# Patient Record
Sex: Female | Born: 1990
Health system: Southern US, Community
[De-identification: ages and names within clinical notes are randomized; demographics above are authoritative.]

## PROBLEM LIST (undated history)

## (undated) HISTORY — PX: ANTERIOR CRUCIATE LIGAMENT REPAIR: SHX115

---

## 2011-12-21 ENCOUNTER — Encounter (HOSPITAL_BASED_OUTPATIENT_CLINIC_OR_DEPARTMENT_OTHER): Payer: Self-pay

## 2011-12-21 ENCOUNTER — Emergency Department (HOSPITAL_BASED_OUTPATIENT_CLINIC_OR_DEPARTMENT_OTHER)
Admission: EM | Admit: 2011-12-21 | Discharge: 2011-12-21 | Disposition: A | Discharge status: Home / Self Care | Payer: Self-pay | Attending: Emergency Medicine | Admitting: Emergency Medicine

## 2011-12-21 DIAGNOSIS — R197 Diarrhea, unspecified: Secondary | ICD-10-CM | POA: Insufficient documentation

## 2011-12-21 DIAGNOSIS — R112 Nausea with vomiting, unspecified: Secondary | ICD-10-CM | POA: Insufficient documentation

## 2011-12-21 DIAGNOSIS — J45909 Unspecified asthma, uncomplicated: Secondary | ICD-10-CM | POA: Insufficient documentation

## 2011-12-21 LAB — DIFFERENTIAL
Basophils Absolute: 0 10*3/uL (ref 0.0–0.1)
Eosinophils Relative: 1 % (ref 0–5)
Lymphocytes Relative: 6 % — ABNORMAL LOW (ref 12–46)
Lymphs Abs: 0.4 10*3/uL — ABNORMAL LOW (ref 0.7–4.0)
Neutro Abs: 5.8 10*3/uL (ref 1.7–7.7)
Neutrophils Relative %: 89 % — ABNORMAL HIGH (ref 43–77)

## 2011-12-21 LAB — URINALYSIS, ROUTINE W REFLEX MICROSCOPIC
Bilirubin Urine: NEGATIVE
Glucose, UA: NEGATIVE mg/dL
Hgb urine dipstick: NEGATIVE
Ketones, ur: 15 mg/dL — AB
Specific Gravity, Urine: 1.028 (ref 1.005–1.030)
pH: 7.5 (ref 5.0–8.0)

## 2011-12-21 LAB — COMPREHENSIVE METABOLIC PANEL
ALT: 9 U/L (ref 0–35)
AST: 19 U/L (ref 0–37)
Alkaline Phosphatase: 62 U/L (ref 39–117)
CO2: 25 mEq/L (ref 19–32)
Calcium: 9.1 mg/dL (ref 8.4–10.5)
GFR calc non Af Amer: 80 mL/min — ABNORMAL LOW (ref >90)
Potassium: 3.9 mEq/L (ref 3.5–5.1)
Sodium: 139 mEq/L (ref 135–145)

## 2011-12-21 LAB — URINE MICROSCOPIC-ADD ON

## 2011-12-21 LAB — CBC
MCV: 86.5 fL (ref 78.0–100.0)
Platelets: 217 10*3/uL (ref 150–400)
RBC: 4.74 MIL/uL (ref 3.87–5.11)
RDW: 11.5 % (ref 11.5–15.5)
WBC: 6.5 10*3/uL (ref 4.0–10.5)

## 2011-12-21 MED ORDER — SODIUM CHLORIDE 0.9 % IV SOLN
Freq: Once | INTRAVENOUS | Status: AC
  Administered 2011-12-21: 999 mL via INTRAVENOUS

## 2011-12-21 MED ORDER — KETOROLAC TROMETHAMINE 30 MG/ML IJ SOLN
30.0000 mg | Freq: Once | INTRAMUSCULAR | Status: AC
  Administered 2011-12-21: 30 mg via INTRAVENOUS
  Filled 2011-12-21: qty 1

## 2011-12-21 MED ORDER — ONDANSETRON HCL 4 MG/2ML IJ SOLN
4.0000 mg | Freq: Once | INTRAMUSCULAR | Status: AC
  Administered 2011-12-21: 4 mg via INTRAVENOUS
  Filled 2011-12-21: qty 2

## 2011-12-21 MED ORDER — PROMETHAZINE HCL 25 MG PO TABS: 25.0000 mg | ORAL_TABLET | Freq: Four times a day (QID) | ORAL | Status: AC | PRN

## 2011-12-21 NOTE — ED Notes (Signed)
abd pain started yesterday-n/v/d started this am

## 2011-12-21 NOTE — ED Notes (Signed)
No v/d while in ED-pt tolerated po-when in to d/c, pt requested RTS note

## 2011-12-21 NOTE — ED Provider Notes (Signed)
History     CSN: [id number]  Arrival date & time 12/21/11  1104   First MD Initiated Contact with Patient 12/21/11 1209      Chief Complaint  Patient presents with  . Abdominal Pain    (Consider location/radiation/quality/duration/timing/severity/associated sxs/prior treatment) Patient is a 21 y.o. female presenting with vomiting. The history is provided by the patient. No language interpreter was used.  Emesis  This is a new problem. The current episode started 12 to 24 hours ago. The problem occurs 5 to 10 times per day. The problem has been gradually worsening. The emesis has an appearance of stomach contents. There has been no fever. Associated symptoms include abdominal pain, diarrhea and myalgias. Risk factors include ill contacts.   Pt complains of vomiting, diarrhea and abdominal soreness.  Pt began feeling sick today. Past Medical History  Diagnosis Date  . Asthma     Past Surgical History  Procedure Date  . Anterior cruciate ligament repair     No family history on file.  History  Substance Use Topics  . Smoking status: Never Smoker   . Smokeless tobacco: Not on file  . Alcohol Use: No    OB History    Grav Para Term Preterm Abortions TAB SAB Ect Mult Living                  Review of Systems  Gastrointestinal: Positive for nausea, vomiting, abdominal pain and diarrhea.  Musculoskeletal: Positive for myalgias.  All other systems reviewed and are negative.    Allergies  Review of patient's allergies indicates no known allergies.  Home Medications   Current Outpatient Rx  Name Route Sig Dispense Refill  . FLOVENT IN Inhalation Inhale into the lungs.    Marland Kitchen SINGULAIR PO Oral Take by mouth.    Marland Kitchen PIRBUTEROL ACETATE 200 MCG/INH IN AERB Inhalation Inhale 2 puffs into the lungs 4 (four) times daily.      BP 109/57  Pulse 99  Temp(Src) 98.9 F (37.2 C) (Oral)  Resp 16  Ht 5\' 3"  (1.6 m)  Wt 150 lb (68.04 kg)  BMI 26.57 kg/m2  SpO2 100%  LMP  12/13/2011  Physical Exam  Vitals reviewed. Constitutional: She is oriented to person, place, and time. She appears well-developed and well-nourished.  HENT:  Head: Normocephalic and atraumatic.  Right Ear: External ear normal.  Left Ear: External ear normal.  Nose: Nose normal.  Mouth/Throat: Oropharynx is clear and moist.  Eyes: Conjunctivae are normal. Pupils are equal, round, and reactive to light.  Neck: Normal range of motion. Neck supple.  Cardiovascular: Normal rate and normal heart sounds.   Pulmonary/Chest: Effort normal.  Abdominal: Soft. Bowel sounds are normal.  Musculoskeletal: Normal range of motion.  Neurological: She is alert and oriented to person, place, and time. She has normal reflexes.  Skin: Skin is warm.  Psychiatric: She has a normal mood and affect.    ED Course  Procedures (including critical care time)  Labs Reviewed  URINALYSIS, ROUTINE W REFLEX MICROSCOPIC - Abnormal; Notable for the following:    APPearance CLOUDY (*)    Ketones, ur 15 (*)    Protein, ur 30 (*)    Leukocytes, UA TRACE (*)    All other components within normal limits  URINE MICROSCOPIC-ADD ON - Abnormal; Notable for the following:    Squamous Epithelial / LPF MANY (*)    Bacteria, UA MANY (*)    All other components within normal limits  DIFFERENTIAL - Abnormal;  Notable for the following:    Neutrophils Relative 89 (*)    Lymphocytes Relative 6 (*)    Lymphs Abs 0.4 (*)    All other components within normal limits  COMPREHENSIVE METABOLIC PANEL - Abnormal; Notable for the following:    GFR calc non Af Amer 80 (*)    All other components within normal limits  PREGNANCY, URINE  CBC   No results found.   No diagnosis found.    MDM   Results for orders placed during the hospital encounter of 12/21/11  URINALYSIS, ROUTINE W REFLEX MICROSCOPIC      Component Value Range   Color, Urine YELLOW  YELLOW    APPearance CLOUDY (*) CLEAR    Specific Gravity, Urine 1.028   1.005 - 1.030    pH 7.5  5.0 - 8.0    Glucose, UA NEGATIVE  NEGATIVE (mg/dL)   Hgb urine dipstick NEGATIVE  NEGATIVE    Bilirubin Urine NEGATIVE  NEGATIVE    Ketones, ur 15 (*) NEGATIVE (mg/dL)   Protein, ur 30 (*) NEGATIVE (mg/dL)   Urobilinogen, UA 1.0  0.0 - 1.0 (mg/dL)   Nitrite NEGATIVE  NEGATIVE    Leukocytes, UA TRACE (*) NEGATIVE   PREGNANCY, URINE      Component Value Range   Preg Test, Ur NEGATIVE    URINE MICROSCOPIC-ADD ON      Component Value Range   Squamous Epithelial / LPF MANY (*) RARE    WBC, UA 3-6  <3 (WBC/hpf)   RBC / HPF 0-2  <3 (RBC/hpf)   Bacteria, UA MANY (*) RARE   CBC      Component Value Range   WBC 6.5  4.0 - 10.5 (K/uL)   RBC 4.74  3.87 - 5.11 (MIL/uL)   Hemoglobin 14.6  12.0 - 15.0 (g/dL)   HCT 40.9  81.1 - 91.4 (%)   MCV 86.5  78.0 - 100.0 (fL)   MCH 30.8  26.0 - 34.0 (pg)   MCHC 35.6  30.0 - 36.0 (g/dL)   RDW 78.2  95.6 - 21.3 (%)   Platelets 217  150 - 400 (K/uL)  DIFFERENTIAL      Component Value Range   Neutrophils Relative 89 (*) 43 - 77 (%)   Neutro Abs 5.8  1.7 - 7.7 (K/uL)   Lymphocytes Relative 6 (*) 12 - 46 (%)   Lymphs Abs 0.4 (*) 0.7 - 4.0 (K/uL)   Monocytes Relative 4  3 - 12 (%)   Monocytes Absolute 0.3  0.1 - 1.0 (K/uL)   Eosinophils Relative 1  0 - 5 (%)   Eosinophils Absolute 0.0  0.0 - 0.7 (K/uL)   Basophils Relative 0  0 - 1 (%)   Basophils Absolute 0.0  0.0 - 0.1 (K/uL)  COMPREHENSIVE METABOLIC PANEL      Component Value Range   Sodium 139  135 - 145 (mEq/L)   Potassium 3.9  3.5 - 5.1 (mEq/L)   Chloride 105  96 - 112 (mEq/L)   CO2 25  19 - 32 (mEq/L)   Glucose, Bld 94  70 - 99 (mg/dL)   BUN 12  6 - 23 (mg/dL)   Creatinine, Ser 0.86  0.50 - 1.10 (mg/dL)   Calcium 9.1  8.4 - 57.8 (mg/dL)   Total Protein 7.2  6.0 - 8.3 (g/dL)   Albumin 4.3  3.5 - 5.2 (g/dL)   AST 19  0 - 37 (U/L)   ALT 9  0 - 35 (U/L)  Alkaline Phosphatase 62  39 - 117 (U/L)   Total Bilirubin 0.8  0.3 - 1.2 (mg/dL)   GFR calc non Af Amer 80  (*) >90 (mL/min)   GFR calc Af Amer >90  >90 (mL/min)   No results found. Pt given iv and zofran.   I advised return if symptoms last more than 24 hours        Kimberly Golden, [place] 12/21/11 1454

## 2011-12-21 NOTE — ED Provider Notes (Signed)
History/physical exam/procedure(s) were performed by non-physician practitioner and as supervising physician I was immediately available for consultation/collaboration. I have reviewed all notes and am in agreement with care and plan.   Hilario Quarry, MD 12/21/11 [phone]

## 2013-05-09 ENCOUNTER — Emergency Department (HOSPITAL_BASED_OUTPATIENT_CLINIC_OR_DEPARTMENT_OTHER): Payer: Self-pay

## 2013-05-09 ENCOUNTER — Emergency Department (HOSPITAL_BASED_OUTPATIENT_CLINIC_OR_DEPARTMENT_OTHER)
Admission: EM | Admit: 2013-05-09 | Discharge: 2013-05-09 | Disposition: A | Discharge status: Home / Self Care | Payer: Self-pay | Attending: Emergency Medicine | Admitting: Emergency Medicine

## 2013-05-09 ENCOUNTER — Encounter (HOSPITAL_BASED_OUTPATIENT_CLINIC_OR_DEPARTMENT_OTHER): Payer: Self-pay | Admitting: Family Medicine

## 2013-05-09 DIAGNOSIS — S60229A Contusion of unspecified hand, initial encounter: Secondary | ICD-10-CM | POA: Insufficient documentation

## 2013-05-09 DIAGNOSIS — Y929 Unspecified place or not applicable: Secondary | ICD-10-CM | POA: Insufficient documentation

## 2013-05-09 DIAGNOSIS — J45909 Unspecified asthma, uncomplicated: Secondary | ICD-10-CM | POA: Insufficient documentation

## 2013-05-09 DIAGNOSIS — F17200 Nicotine dependence, unspecified, uncomplicated: Secondary | ICD-10-CM | POA: Insufficient documentation

## 2013-05-09 DIAGNOSIS — Y93B9 Activity, other involving muscle strengthening exercises: Secondary | ICD-10-CM | POA: Insufficient documentation

## 2013-05-09 DIAGNOSIS — S60221A Contusion of right hand, initial encounter: Secondary | ICD-10-CM

## 2013-05-09 DIAGNOSIS — IMO0002 Reserved for concepts with insufficient information to code with codable children: Secondary | ICD-10-CM | POA: Insufficient documentation

## 2013-05-09 MED ORDER — IBUPROFEN 600 MG PO TABS: 600.0000 mg | ORAL_TABLET | Freq: Four times a day (QID) | ORAL | Status: DC | PRN

## 2013-05-09 MED ORDER — IBUPROFEN 400 MG PO TABS
400.0000 mg | ORAL_TABLET | Freq: Once | ORAL | Status: AC
  Administered 2013-05-09: 400 mg via ORAL
  Filled 2013-05-09: qty 1

## 2013-05-09 NOTE — ED Notes (Signed)
Pt sts she was exercising by hitting a punching bag and had swollen and painful right hand x 3 days. Cms intact.

## 2013-05-09 NOTE — ED Provider Notes (Signed)
History     CSN: [id number]  Arrival date & time 05/09/13  4098   First MD Initiated Contact with Patient 05/09/13 [phone]      Chief Complaint  Patient presents with  . Hand Pain    (Consider location/radiation/quality/duration/timing/severity/associated sxs/prior treatment) Patient is a 22 y.o. female presenting with hand pain. The history is provided by the patient.  Hand Pain This is a new problem. The current episode started more than 2 days ago (Pt had been punching a punching bag when pain and swelling first began.). The problem occurs constantly. The problem has been gradually improving. Exacerbated by: palpation and making a fist, especially in the mornings. Nothing relieves the symptoms. She has tried a cold compress for the symptoms. The treatment provided mild relief.    Past Medical History  Diagnosis Date  . Asthma     Past Surgical History  Procedure Laterality Date  . Anterior cruciate ligament repair      History reviewed. No pertinent family history.  History  Substance Use Topics  . Smoking status: Current Some Day Smoker  . Smokeless tobacco: Not on file  . Alcohol Use: No    OB History   Grav Para Term Preterm Abortions TAB SAB Ect Mult Living                  Review of Systems  Musculoskeletal: Positive for joint swelling and arthralgias.  Skin: Negative for color change and wound.  Neurological: Negative for weakness and numbness.    Allergies  Review of patient's allergies indicates no known allergies.  Home Medications   Current Outpatient Rx  Name  Route  Sig  Dispense  Refill  . Fluticasone Propionate, Inhal, (FLOVENT IN)   Inhalation   Inhale into the lungs.         Marland Kitchen ibuprofen (ADVIL,MOTRIN) 600 MG tablet   Oral   Take 1 tablet (600 mg total) by mouth every 6 (six) hours as needed for pain.   30 tablet   0   . Montelukast Sodium (SINGULAIR PO)   Oral   Take by mouth.         . pirbuterol (MAXAIR) 200 MCG/INH  inhaler   Inhalation   Inhale 2 puffs into the lungs 4 (four) times daily.           BP 124/81  Pulse 68  Temp(Src) 98.4 F (36.9 C) (Oral)  Resp 20  SpO2 100%  LMP 05/01/2013  Physical Exam  Nursing note and vitals reviewed. Constitutional: She appears well-developed and well-nourished. No distress.  Musculoskeletal:       Right wrist: Normal. She exhibits normal range of motion, no tenderness and no bony tenderness.       Right hand: She exhibits tenderness. She exhibits normal range of motion, normal capillary refill, no deformity, no laceration and no swelling. Normal sensation noted. Normal strength noted.       Hands: Neurological: She is alert. She exhibits normal muscle tone. Coordination normal.  Skin: Skin is warm. No rash noted. No pallor.    ED Course  Procedures (including critical care time)  Labs Reviewed - No data to display Dg Hand Complete Right  05/09/2013   *RADIOLOGY REPORT*  Clinical Data: Hand pain secondary to blunt trauma.  RIGHT HAND - COMPLETE 3+ VIEW  Comparison: None.  Findings: There is no fracture, dislocation, or other abnormality.  IMPRESSION: Normal exam.   Original Report Authenticated By: Francene Boyers, M.D.  1. Contusion, hand, right, initial encounter       MDM  Pt with sprain or contusion.  No break in skin.  Xrays negative.  RICE at home.        Gavin Pound. Laren Orama, MD 05/09/13 1010

## 2013-10-30 ENCOUNTER — Encounter (HOSPITAL_BASED_OUTPATIENT_CLINIC_OR_DEPARTMENT_OTHER): Payer: Self-pay | Admitting: Emergency Medicine

## 2013-10-30 ENCOUNTER — Emergency Department (HOSPITAL_BASED_OUTPATIENT_CLINIC_OR_DEPARTMENT_OTHER)
Admission: EM | Admit: 2013-10-30 | Discharge: 2013-10-30 | Disposition: A | Discharge status: Home / Self Care | Payer: Self-pay | Attending: Emergency Medicine | Admitting: Emergency Medicine

## 2013-10-30 DIAGNOSIS — J45909 Unspecified asthma, uncomplicated: Secondary | ICD-10-CM | POA: Insufficient documentation

## 2013-10-30 DIAGNOSIS — N760 Acute vaginitis: Secondary | ICD-10-CM | POA: Insufficient documentation

## 2013-10-30 DIAGNOSIS — Z87891 Personal history of nicotine dependence: Secondary | ICD-10-CM | POA: Insufficient documentation

## 2013-10-30 DIAGNOSIS — B9689 Other specified bacterial agents as the cause of diseases classified elsewhere: Secondary | ICD-10-CM | POA: Insufficient documentation

## 2013-10-30 DIAGNOSIS — Z3202 Encounter for pregnancy test, result negative: Secondary | ICD-10-CM | POA: Insufficient documentation

## 2013-10-30 DIAGNOSIS — A499 Bacterial infection, unspecified: Secondary | ICD-10-CM | POA: Insufficient documentation

## 2013-10-30 DIAGNOSIS — Z79899 Other long term (current) drug therapy: Secondary | ICD-10-CM | POA: Insufficient documentation

## 2013-10-30 LAB — URINALYSIS, ROUTINE W REFLEX MICROSCOPIC
Bilirubin Urine: NEGATIVE
Ketones, ur: 15 mg/dL — AB
Specific Gravity, Urine: 1.03 (ref 1.005–1.030)
Urobilinogen, UA: 1 mg/dL (ref 0.0–1.0)
pH: 6.5 (ref 5.0–8.0)

## 2013-10-30 LAB — URINE MICROSCOPIC-ADD ON

## 2013-10-30 MED ORDER — METRONIDAZOLE 500 MG PO TABS: 500.0000 mg | ORAL_TABLET | Freq: Two times a day (BID) | ORAL | Status: DC

## 2013-10-30 NOTE — ED Notes (Signed)
MD with pt  

## 2013-10-30 NOTE — ED Provider Notes (Addendum)
CSN: [id number]     Arrival date & time 10/30/13  0401 History   First MD Initiated Contact with Patient 10/30/13 [phone]     Chief Complaint  Patient presents with  . Abdominal Pain   (Consider location/radiation/quality/duration/timing/severity/associated sxs/prior Treatment) HPI This is a 22 year old female who began having body aches and suprapubic discomfort about noon yesterday. The symptoms have been intermittent but persistent. She has felt subjectively febrile alternating with feeling cold. She has also had nasal congestion. Her symptoms are mild to moderate. Her suprapubic discomfort is not changed with palpation or movement. She denies dysuria, vaginal bleeding, vaginal discharge, nausea, vomiting or diarrhea. She has not taken any medications for this. She states her mother has a respiratory viral illness.  Past Medical History  Diagnosis Date  . Asthma    Past Surgical History  Procedure Laterality Date  . Anterior cruciate ligament repair     History reviewed. No pertinent family history. History  Substance Use Topics  . Smoking status: Former Games developer  . Smokeless tobacco: Never Used  . Alcohol Use: No   OB History   Grav Para Term Preterm Abortions TAB SAB Ect Mult Living                 Review of Systems  All other systems reviewed and are negative.    Allergies  Review of patient's allergies indicates no known allergies.  Home Medications   Current Outpatient Rx  Name  Route  Sig  Dispense  Refill  . Fluticasone Propionate, Inhal, (FLOVENT IN)   Inhalation   Inhale into the lungs.         Marland Kitchen ibuprofen (ADVIL,MOTRIN) 600 MG tablet   Oral   Take 1 tablet (600 mg total) by mouth every 6 (six) hours as needed for pain.   30 tablet   0   . Montelukast Sodium (SINGULAIR PO)   Oral   Take by mouth.         . pirbuterol (MAXAIR) 200 MCG/INH inhaler   Inhalation   Inhale 2 puffs into the lungs 4 (four) times daily.          BP 122/72  Pulse 73   Temp(Src) 98.1 F (36.7 C) (Oral)  Resp 20  Ht 5\' 3"  (1.6 m)  Wt 125 lb (56.7 kg)  BMI 22.15 kg/m2  SpO2 100%  LMP 10/23/2013  Physical Exam General: Well-developed, well-nourished female in no acute distress; appearance consistent with age of record HENT: normocephalic; atraumatic; no pharyngeal erythema or exudate Eyes: pupils equal, round and reactive to light; extraocular muscles intact Neck: supple Heart: regular rate and rhythm; no murmurs, rubs or gallops Lungs: clear to auscultation bilaterally Abdomen: soft; nondistended; nontender; no masses or hepatosplenomegaly; bowel sounds present GU: No CVA tenderness; normal external genitalia; blood-tinged vaginal discharge; no cervical motion tenderness; no adnexal tenderness; polypoid mass right vaginal wall, nontender Extremities: No deformity; full range of motion; pulses normal; no edema Neurologic: Awake, alert and oriented; motor function intact in all extremities and symmetric; no facial droop Skin: Warm and dry Psychiatric: Normal mood and affect    ED Course  Procedures (including critical care time)  MDM   Nursing notes and vitals signs, including pulse oximetry, reviewed.  Summary of this visit's results, reviewed by myself:  Labs:  Results for orders placed during the hospital encounter of 10/30/13 (from the past 24 hour(s))  URINALYSIS, ROUTINE W REFLEX MICROSCOPIC     Status: Abnormal   Collection Time  10/30/13  4:22 AM      Result Value Range   Color, Urine YELLOW  YELLOW   APPearance CLOUDY (*) CLEAR   Specific Gravity, Urine 1.030  1.005 - 1.030   pH 6.5  5.0 - 8.0   Glucose, UA NEGATIVE  NEGATIVE mg/dL   Hgb urine dipstick TRACE (*) NEGATIVE   Bilirubin Urine NEGATIVE  NEGATIVE   Ketones, ur 15 (*) NEGATIVE mg/dL   Protein, ur NEGATIVE  NEGATIVE mg/dL   Urobilinogen, UA 1.0  0.0 - 1.0 mg/dL   Nitrite NEGATIVE  NEGATIVE   Leukocytes, UA SMALL (*) NEGATIVE  PREGNANCY, URINE     Status: None    Collection Time    10/30/13  4:22 AM      Result Value Range   Preg Test, Ur NEGATIVE  NEGATIVE  URINE MICROSCOPIC-ADD ON     Status: Abnormal   Collection Time    10/30/13  4:22 AM      Result Value Range   Squamous Epithelial / LPF FEW (*) RARE   WBC, UA 0-2  <3 WBC/hpf   RBC / HPF 0-2  <3 RBC/hpf   Bacteria, UA MANY (*) RARE   Urine-Other MICROSCOPIC EXAM PERFORMED ON UNCONCENTRATED URINE    WET PREP, GENITAL     Status: Abnormal   Collection Time    10/30/13  4:58 AM      Result Value Range   Yeast Wet Prep HPF POC NONE SEEN  NONE SEEN   Trich, Wet Prep NONE SEEN  NONE SEEN   Clue Cells Wet Prep HPF POC MODERATE (*) NONE SEEN   WBC, Wet Prep HPF POC MANY (*) NONE SEEN    Will treat for BV. GC/chlamydia pending. Suspect early viral illness causing systemic symptoms. Advised of vaginal polyp, will refer to [facility].    Hanley Seamen, MD 10/30/13 0510  Hanley Seamen, MD 10/30/13 [phone]

## 2013-10-30 NOTE — ED Notes (Signed)
C/o abd pain that started Sunday afternoon.describes as a sharp/cramping type pain that comes and goes.  Denies any vaginal d/c.  States she felt "hot and cold" c/o general h/a denies any cough. States she feels like she is aching all over. Denies any n/v/d. C/o congestion.

## 2013-10-30 NOTE — ED Notes (Signed)
Pt c/o body aches, lower abdominal pain x1 day, denies dysuria, denies fever, denies N/V/D.

## 2013-11-04 ENCOUNTER — Encounter (HOSPITAL_BASED_OUTPATIENT_CLINIC_OR_DEPARTMENT_OTHER): Payer: Self-pay | Admitting: Emergency Medicine

## 2013-11-04 ENCOUNTER — Emergency Department (HOSPITAL_BASED_OUTPATIENT_CLINIC_OR_DEPARTMENT_OTHER)
Admission: EM | Admit: 2013-11-04 | Discharge: 2013-11-04 | Disposition: A | Discharge status: Home / Self Care | Payer: Self-pay | Attending: Emergency Medicine | Admitting: Emergency Medicine

## 2013-11-04 DIAGNOSIS — Z792 Long term (current) use of antibiotics: Secondary | ICD-10-CM | POA: Insufficient documentation

## 2013-11-04 DIAGNOSIS — Z79899 Other long term (current) drug therapy: Secondary | ICD-10-CM | POA: Insufficient documentation

## 2013-11-04 DIAGNOSIS — Z87891 Personal history of nicotine dependence: Secondary | ICD-10-CM | POA: Insufficient documentation

## 2013-11-04 DIAGNOSIS — R197 Diarrhea, unspecified: Secondary | ICD-10-CM | POA: Insufficient documentation

## 2013-11-04 DIAGNOSIS — R1110 Vomiting, unspecified: Secondary | ICD-10-CM | POA: Insufficient documentation

## 2013-11-04 DIAGNOSIS — IMO0002 Reserved for concepts with insufficient information to code with codable children: Secondary | ICD-10-CM | POA: Insufficient documentation

## 2013-11-04 DIAGNOSIS — J45909 Unspecified asthma, uncomplicated: Secondary | ICD-10-CM | POA: Insufficient documentation

## 2013-11-04 MED ORDER — ONDANSETRON 8 MG PO TBDP
8.0000 mg | ORAL_TABLET | Freq: Once | ORAL | Status: AC
  Administered 2013-11-04: 8 mg via ORAL
  Filled 2013-11-04: qty 1

## 2013-11-04 NOTE — ED Provider Notes (Signed)
CSN: [id number]     Arrival date & time 11/04/13  1908 History   First MD Initiated Contact with Patient 11/04/13 2106     Chief Complaint  Patient presents with  . Emesis   (Consider location/radiation/quality/duration/timing/severity/associated sxs/prior Treatment) Patient is a 22 y.o. female presenting with vomiting.  Emesis Severity:  Mild Duration:  1 day Number of daily episodes:  1 Able to tolerate:  Liquids Progression:  Unchanged Chronicity:  New Relieved by:  Nothing Worsened by:  Nothing tried Ineffective treatments:  None tried Associated symptoms: diarrhea   Associated symptoms: no abdominal pain   Pt complains of diarrhea since starting flagyl.   Pt has one pill left.   Pt vomitted once today  Past Medical History  Diagnosis Date  . Asthma    Past Surgical History  Procedure Laterality Date  . Anterior cruciate ligament repair     No family history on file. History  Substance Use Topics  . Smoking status: Former Games developer  . Smokeless tobacco: Never Used  . Alcohol Use: No   OB History   Grav Para Term Preterm Abortions TAB SAB Ect Mult Living                 Review of Systems  Gastrointestinal: Positive for vomiting and diarrhea. Negative for abdominal pain.  All other systems reviewed and are negative.    Allergies  Review of patient's allergies indicates no known allergies.  Home Medications   Current Outpatient Rx  Name  Route  Sig  Dispense  Refill  . Fluticasone Propionate, Inhal, (FLOVENT IN)   Inhalation   Inhale into the lungs.         Marland Kitchen ibuprofen (ADVIL,MOTRIN) 600 MG tablet   Oral   Take 1 tablet (600 mg total) by mouth every 6 (six) hours as needed for pain.   30 tablet   0   . metroNIDAZOLE (FLAGYL) 500 MG tablet   Oral   Take 1 tablet (500 mg total) by mouth 2 (two) times daily. One po bid x 7 days   14 tablet   0   . Montelukast Sodium (SINGULAIR PO)   Oral   Take by mouth.         . pirbuterol (MAXAIR) 200  MCG/INH inhaler   Inhalation   Inhale 2 puffs into the lungs 4 (four) times daily.          BP 118/81  Pulse 60  Temp(Src) 98.6 F (37 C) (Oral)  Resp 20  Ht 5\' 3"  (1.6 m)  Wt 120 lb (54.432 kg)  BMI 21.26 kg/m2  SpO2 100%  LMP 10/23/2013 Physical Exam  Nursing note and vitals reviewed. Constitutional: She appears well-developed and well-nourished.  HENT:  Head: Normocephalic.  Right Ear: External ear normal.  Left Ear: External ear normal.  Nose: Nose normal.  Mouth/Throat: Oropharynx is clear and moist.  Eyes: Conjunctivae and EOM are normal. Pupils are equal, round, and reactive to light.  Neck: Normal range of motion. Neck supple.  Cardiovascular: Normal rate and regular rhythm.   Pulmonary/Chest: Effort normal and breath sounds normal.  Abdominal: Soft. There is tenderness.  Musculoskeletal: Normal range of motion.  Neurological: She is alert.  Skin: Skin is warm.  Psychiatric: She has a normal mood and affect.    ED Course  Procedures (including critical care time) Labs Review Labs Reviewed - No data to display Imaging Review No results found.  EKG Interpretation   None  MDM   1. Diarrhea   2. Vomiting    Labs reviewed upt negative 5 days ago.   Pt given zofran.   Pt able to tolerate po fluids without difficulty   Elson Areas, PA-C 11/04/13 2219

## 2013-11-04 NOTE — ED Notes (Signed)
Pt reports N/V/D since Monday - states emesis x1 in the last 24 hours - reports diarrhea x4 today - denies pain.

## 2013-11-04 NOTE — ED Provider Notes (Signed)
Medical screening examination/treatment/procedure(s) were performed by non-physician practitioner and as supervising physician I was immediately available for consultation/collaboration.  EKG Interpretation   None        Ethelda Chick, MD 11/04/13 2235

## 2013-12-11 ENCOUNTER — Encounter: Payer: Self-pay | Admitting: Nurse Practitioner

## 2013-12-29 ENCOUNTER — Emergency Department (HOSPITAL_BASED_OUTPATIENT_CLINIC_OR_DEPARTMENT_OTHER): Payer: Self-pay

## 2013-12-29 ENCOUNTER — Emergency Department (HOSPITAL_BASED_OUTPATIENT_CLINIC_OR_DEPARTMENT_OTHER)
Admission: EM | Admit: 2013-12-29 | Discharge: 2013-12-29 | Disposition: A | Discharge status: Home / Self Care | Payer: Self-pay | Attending: Emergency Medicine | Admitting: Emergency Medicine

## 2013-12-29 ENCOUNTER — Encounter (HOSPITAL_BASED_OUTPATIENT_CLINIC_OR_DEPARTMENT_OTHER): Payer: Self-pay | Admitting: Emergency Medicine

## 2013-12-29 DIAGNOSIS — Z79899 Other long term (current) drug therapy: Secondary | ICD-10-CM | POA: Insufficient documentation

## 2013-12-29 DIAGNOSIS — Z87891 Personal history of nicotine dependence: Secondary | ICD-10-CM | POA: Insufficient documentation

## 2013-12-29 DIAGNOSIS — R079 Chest pain, unspecified: Secondary | ICD-10-CM | POA: Insufficient documentation

## 2013-12-29 DIAGNOSIS — J069 Acute upper respiratory infection, unspecified: Secondary | ICD-10-CM | POA: Insufficient documentation

## 2013-12-29 DIAGNOSIS — Z792 Long term (current) use of antibiotics: Secondary | ICD-10-CM | POA: Insufficient documentation

## 2013-12-29 DIAGNOSIS — J45901 Unspecified asthma with (acute) exacerbation: Secondary | ICD-10-CM | POA: Insufficient documentation

## 2013-12-29 DIAGNOSIS — IMO0002 Reserved for concepts with insufficient information to code with codable children: Secondary | ICD-10-CM | POA: Insufficient documentation

## 2013-12-29 MED ORDER — SALINE SPRAY 0.65 % NA SOLN
1.0000 | NASAL | Status: DC | PRN
Start: 2013-12-29 — End: 2014-11-25

## 2013-12-29 NOTE — ED Notes (Signed)
Patient transported to X-ray 

## 2013-12-29 NOTE — ED Notes (Signed)
Patient states she has a two day history of sinus congestion and productive cough with dark green secretions.  Denies fever.

## 2013-12-29 NOTE — Discharge Instructions (Signed)

## 2013-12-29 NOTE — ED Provider Notes (Signed)
CSN: [id number]     Arrival date & time 12/29/13  1137 History   First MD Initiated Contact with Patient 12/29/13 1155     Chief Complaint  Patient presents with  . Cough   (Consider location/radiation/quality/duration/timing/severity/associated sxs/prior Treatment) HPI  This is a 23 year old female with history of asthma who presents with rhinorrhea, sinus congestion, cough, and shortness of breath the last 2 days. She denies any fevers. She has had sick contacts. Patient has not taken anything at home. Patient reports shortness of breath and cough. She also reports anterior chest pain that is worse with coughing. She denies any nausea, vomiting, diarrhea.  Past Medical History  Diagnosis Date  . Asthma    Past Surgical History  Procedure Laterality Date  . Anterior cruciate ligament repair     No family history on file. History  Substance Use Topics  . Smoking status: Former Games developer  . Smokeless tobacco: Never Used  . Alcohol Use: No   OB History   Grav Para Term Preterm Abortions TAB SAB Ect Mult Living                 Review of Systems  Constitutional: Negative for fever.  HENT: Positive for rhinorrhea and sinus pressure. Negative for ear pain.   Respiratory: Positive for cough and shortness of breath. Negative for chest tightness.   Cardiovascular: Positive for chest pain.  Gastrointestinal: Negative for nausea, vomiting and abdominal pain.  Genitourinary: Negative for dysuria.  Musculoskeletal: Negative for back pain.  Skin: Negative for wound.  Neurological: Negative for headaches.  Psychiatric/Behavioral: Negative for confusion.  All other systems reviewed and are negative.    Allergies  Flagyl and Ibuprofen  Home Medications   Current Outpatient Rx  Name  Route  Sig  Dispense  Refill  . Fluticasone Propionate, Inhal, (FLOVENT IN)   Inhalation   Inhale into the lungs.         Marland Kitchen ibuprofen (ADVIL,MOTRIN) 600 MG tablet   Oral   Take 1 tablet (600 mg  total) by mouth every 6 (six) hours as needed for pain.   30 tablet   0   . metroNIDAZOLE (FLAGYL) 500 MG tablet   Oral   Take 1 tablet (500 mg total) by mouth 2 (two) times daily. One po bid x 7 days   14 tablet   0   . Montelukast Sodium (SINGULAIR PO)   Oral   Take by mouth.         . pirbuterol (MAXAIR) 200 MCG/INH inhaler   Inhalation   Inhale 2 puffs into the lungs 4 (four) times daily.         . sodium chloride (OCEAN) 0.65 % SOLN nasal spray   Each Nare   Place 1 spray into both nostrils as needed for congestion.   1 Bottle   0    BP 107/70  Temp(Src) 98.4 F (36.9 C) (Oral)  Resp 20  SpO2 100%  LMP 12/26/2013 Physical Exam  Nursing note and vitals reviewed. Constitutional: She is oriented to person, place, and time. She appears well-developed and well-nourished. No distress.  HENT:  Head: Normocephalic and atraumatic.  Right Ear: External ear normal.  Left Ear: External ear normal.  Mouth/Throat: Oropharynx is clear and moist.  Eyes: Pupils are equal, round, and reactive to light.  Neck: Neck supple.  Cardiovascular: Normal rate, regular rhythm and normal heart sounds.   No murmur heard. Pulmonary/Chest: Effort normal and breath sounds normal. No respiratory distress. She  has no wheezes. She exhibits no tenderness.  Abdominal: Soft. There is no tenderness.  Neurological: She is alert and oriented to person, place, and time.  Skin: Skin is warm and dry.  Psychiatric: She has a normal mood and affect.    ED Course  Procedures (including critical care time) Labs Review Labs Reviewed - No data to display Imaging Review Dg Chest 2 View  12/29/2013   CLINICAL DATA:  Cough.  EXAM: CHEST  2 VIEW  COMPARISON:  None.  FINDINGS: The heart size and mediastinal contours are within normal limits. Both lungs are clear. The visualized skeletal structures are unremarkable.  IMPRESSION: No active cardiopulmonary disease.   Electronically Signed   By: Maisie Fushomas   Register   On: 12/29/2013 12:48    EKG Interpretation   None       MDM   1. Upper respiratory infection    Patient presents with upper respiratory symptoms. She is nontoxic-appearing on exam and vital signs are reassuring. Patient is concerned that she may have pneumonia. I discussed with the patient this is likely a viral etiology given her exam and history and absence of fevers. I did obtain a chest x-ray which is negative. Have encouraged patient to use supportive care at home including nasal saline and Tylenol and Motrin. Patient was given strict return precautions.  After history, exam, and medical workup I feel the patient has been appropriately medically screened and is safe for discharge home. Pertinent diagnoses were discussed with the patient. Patient was given return precautions.     Shon Batonourtney F Cydney Alvarenga, MD 12/29/13 [phone]

## 2013-12-29 NOTE — ED Notes (Signed)
Pt returned from X-ray.  

## 2013-12-31 ENCOUNTER — Encounter (HOSPITAL_BASED_OUTPATIENT_CLINIC_OR_DEPARTMENT_OTHER): Payer: Self-pay | Admitting: Emergency Medicine

## 2013-12-31 ENCOUNTER — Emergency Department (HOSPITAL_BASED_OUTPATIENT_CLINIC_OR_DEPARTMENT_OTHER)
Admission: EM | Admit: 2013-12-31 | Discharge: 2013-12-31 | Disposition: A | Discharge status: Home / Self Care | Payer: Self-pay | Attending: Emergency Medicine | Admitting: Emergency Medicine

## 2013-12-31 DIAGNOSIS — Z792 Long term (current) use of antibiotics: Secondary | ICD-10-CM | POA: Insufficient documentation

## 2013-12-31 DIAGNOSIS — Z87891 Personal history of nicotine dependence: Secondary | ICD-10-CM | POA: Insufficient documentation

## 2013-12-31 DIAGNOSIS — Z79899 Other long term (current) drug therapy: Secondary | ICD-10-CM | POA: Insufficient documentation

## 2013-12-31 DIAGNOSIS — J45909 Unspecified asthma, uncomplicated: Secondary | ICD-10-CM | POA: Insufficient documentation

## 2013-12-31 DIAGNOSIS — IMO0002 Reserved for concepts with insufficient information to code with codable children: Secondary | ICD-10-CM | POA: Insufficient documentation

## 2013-12-31 DIAGNOSIS — Z3202 Encounter for pregnancy test, result negative: Secondary | ICD-10-CM | POA: Insufficient documentation

## 2013-12-31 DIAGNOSIS — R1110 Vomiting, unspecified: Secondary | ICD-10-CM | POA: Insufficient documentation

## 2013-12-31 LAB — PREGNANCY, URINE: Preg Test, Ur: NEGATIVE

## 2013-12-31 LAB — URINALYSIS, ROUTINE W REFLEX MICROSCOPIC
Glucose, UA: NEGATIVE mg/dL
HGB URINE DIPSTICK: NEGATIVE
Nitrite: NEGATIVE
PH: 6 (ref 5.0–8.0)
Protein, ur: 100 mg/dL — AB
SPECIFIC GRAVITY, URINE: 1.027 (ref 1.005–1.030)
Urobilinogen, UA: 1 mg/dL (ref 0.0–1.0)

## 2013-12-31 LAB — URINE MICROSCOPIC-ADD ON

## 2013-12-31 MED ORDER — ALBUTEROL SULFATE HFA 108 (90 BASE) MCG/ACT IN AERS
2.0000 | INHALATION_SPRAY | RESPIRATORY_TRACT | Status: DC | PRN
  Administered 2013-12-31: 2 via RESPIRATORY_TRACT
  Filled 2013-12-31 (×2): qty 6.7

## 2013-12-31 MED ORDER — ONDANSETRON 4 MG PO TBDP
4.0000 mg | ORAL_TABLET | Freq: Once | ORAL | Status: AC
  Administered 2013-12-31: 4 mg via ORAL
  Filled 2013-12-31: qty 1

## 2013-12-31 NOTE — ED Provider Notes (Signed)
CSN: [id number]     Arrival date & time 12/31/13  [id number] History   First MD Initiated Contact with Patient 12/31/13 0445     Chief Complaint  Patient presents with  . Vomited    (Consider location/radiation/quality/duration/timing/severity/associated sxs/prior Treatment) HPI This is a 23 year old female was seen here 2 days ago for cough, nasal congestion and chest wall pain. A chest x-ray at that time was unremarkable and she was discharged without any medications. She states she does not have an albuterol inhaler and thought that she was going to be prescribed one on her previous visit.  Since that visit her symptoms have improved. She is here this morning because of one episode of vomiting that woke her from sleep just prior to arrival. She is no longer nauseated. She has had no diarrhea. She has not had a fever. She has not had abdominal pain. She states the vomiting was not brought on by a paroxysm of cough.  Past Medical History  Diagnosis Date  . Asthma    Past Surgical History  Procedure Laterality Date  . Anterior cruciate ligament repair     No family history on file. History  Substance Use Topics  . Smoking status: Former Games developermoker  . Smokeless tobacco: Never Used  . Alcohol Use: No   OB History   Grav Para Term Preterm Abortions TAB SAB Ect Mult Living                 Review of Systems  All other systems reviewed and are negative.    Allergies  Flagyl and Ibuprofen  Home Medications   Current Outpatient Rx  Name  Route  Sig  Dispense  Refill  . Fluticasone Propionate, Inhal, (FLOVENT IN)   Inhalation   Inhale into the lungs.         Marland Kitchen. ibuprofen (ADVIL,MOTRIN) 600 MG tablet   Oral   Take 1 tablet (600 mg total) by mouth every 6 (six) hours as needed for pain.   30 tablet   0   . metroNIDAZOLE (FLAGYL) 500 MG tablet   Oral   Take 1 tablet (500 mg total) by mouth 2 (two) times daily. One po bid x 7 days   14 tablet   0   . Montelukast Sodium  (SINGULAIR PO)   Oral   Take by mouth.         . pirbuterol (MAXAIR) 200 MCG/INH inhaler   Inhalation   Inhale 2 puffs into the lungs 4 (four) times daily.         . sodium chloride (OCEAN) 0.65 % SOLN nasal spray   Each Nare   Place 1 spray into both nostrils as needed for congestion.   1 Bottle   0    BP 119/82  Pulse 63  Temp(Src) 98.5 F (36.9 C) (Oral)  Resp 20  Ht 5\' 3"  (1.6 m)  Wt 120 lb (54.432 kg)  BMI 21.26 kg/m2  SpO2 100%  LMP 12/26/2013  Physical Exam General: Well-developed, well-nourished female in no acute distress; appearance consistent with age of record HENT: normocephalic; atraumatic Eyes: pupils equal, round and reactive to light; extraocular muscles intact Neck: supple Heart: regular rate and rhythm Lungs: clear to auscultation bilaterally Abdomen: soft; nondistended; nontender; no masses or hepatosplenomegaly; bowel sounds present Extremities: No deformity; full range of motion; pulses normal Neurologic: Awake, alert and oriented; motor function intact in all extremities and symmetric; no facial droop Skin: Warm and dry Psychiatric: Normal mood and affect  ED Course  Procedures (including critical care time)    MDM   Nursing notes and vitals signs, including pulse oximetry, reviewed.  Summary of this visit's results, reviewed by myself:  Labs:  Results for orders placed during the hospital encounter of 12/31/13 (from the past 24 hour(s))  PREGNANCY, URINE     Status: None   Collection Time    12/31/13  4:53 AM      Result Value Range   Preg Test, Ur NEGATIVE  NEGATIVE  URINALYSIS, ROUTINE W REFLEX MICROSCOPIC     Status: Abnormal   Collection Time    12/31/13  4:53 AM      Result Value Range   Color, Urine YELLOW  YELLOW   APPearance CLOUDY (*) CLEAR   Specific Gravity, Urine 1.027  1.005 - 1.030   pH 6.0  5.0 - 8.0   Glucose, UA NEGATIVE  NEGATIVE mg/dL   Hgb urine dipstick NEGATIVE  NEGATIVE   Bilirubin Urine SMALL  (*) NEGATIVE   Ketones, ur >80 (*) NEGATIVE mg/dL   Protein, ur 161 (*) NEGATIVE mg/dL   Urobilinogen, UA 1.0  0.0 - 1.0 mg/dL   Nitrite NEGATIVE  NEGATIVE   Leukocytes, UA SMALL (*) NEGATIVE  URINE MICROSCOPIC-ADD ON     Status: Abnormal   Collection Time    12/31/13  4:53 AM      Result Value Range   Squamous Epithelial / LPF MANY (*) RARE   WBC, UA 3-6  <3 WBC/hpf   RBC / HPF 0-2  <3 RBC/hpf   Bacteria, UA FEW (*) RARE   Urine-Other MUCOUS PRESENT          Hanley Seamen, MD 12/31/13 [phone]

## 2013-12-31 NOTE — ED Notes (Signed)
Pt reports 1 episode of vomitting.  Sts her chest hurts when she cough. Reports cough hasn't gotten any better.

## 2014-01-01 LAB — URINE CULTURE

## 2014-02-21 ENCOUNTER — Emergency Department (HOSPITAL_BASED_OUTPATIENT_CLINIC_OR_DEPARTMENT_OTHER)
Admission: EM | Admit: 2014-02-21 | Discharge: 2014-02-21 | Disposition: A | Discharge status: Home / Self Care | Payer: Self-pay | Attending: Emergency Medicine | Admitting: Emergency Medicine

## 2014-02-21 ENCOUNTER — Encounter (HOSPITAL_BASED_OUTPATIENT_CLINIC_OR_DEPARTMENT_OTHER): Payer: Self-pay | Admitting: Emergency Medicine

## 2014-02-21 DIAGNOSIS — R51 Headache: Secondary | ICD-10-CM | POA: Insufficient documentation

## 2014-02-21 DIAGNOSIS — R519 Headache, unspecified: Secondary | ICD-10-CM

## 2014-02-21 DIAGNOSIS — Z79899 Other long term (current) drug therapy: Secondary | ICD-10-CM | POA: Insufficient documentation

## 2014-02-21 DIAGNOSIS — Z792 Long term (current) use of antibiotics: Secondary | ICD-10-CM | POA: Insufficient documentation

## 2014-02-21 DIAGNOSIS — Z87891 Personal history of nicotine dependence: Secondary | ICD-10-CM | POA: Insufficient documentation

## 2014-02-21 DIAGNOSIS — J45909 Unspecified asthma, uncomplicated: Secondary | ICD-10-CM | POA: Insufficient documentation

## 2014-02-21 MED ORDER — KETOROLAC TROMETHAMINE 60 MG/2ML IM SOLN
30.0000 mg | Freq: Once | INTRAMUSCULAR | Status: AC
  Administered 2014-02-21: 30 mg via INTRAMUSCULAR
  Filled 2014-02-21: qty 2

## 2014-02-21 NOTE — Discharge Instructions (Signed)
Migraine Headache A migraine headache is an intense, throbbing pain on one or both sides of your head. A migraine can last for 30 minutes to several hours. CAUSES  The exact cause of a migraine headache is not always known. However, a migraine may be caused when nerves in the brain become irritated and release chemicals that cause inflammation. This causes pain. Certain things may also trigger migraines, such as:  Alcohol.  Smoking.  Stress.  Menstruation.  Aged cheeses.  Foods or drinks that contain nitrates, glutamate, aspartame, or tyramine.  Lack of sleep.  Chocolate.  Caffeine.  Hunger.  Physical exertion.  Fatigue.  Medicines used to treat chest pain (nitroglycerine), birth control pills, estrogen, and some blood pressure medicines. SIGNS AND SYMPTOMS  Pain on one or both sides of your head.  Pulsating or throbbing pain.  Severe pain that prevents daily activities.  Pain that is aggravated by any physical activity.  Nausea, vomiting, or both.  Dizziness.  Pain with exposure to bright lights, loud noises, or activity.  General sensitivity to bright lights, loud noises, or smells. Before you get a migraine, you may get warning signs that a migraine is coming (aura). An aura may include:  Seeing flashing lights.  Seeing bright spots, halos, or zig-zag lines.  Having tunnel vision or blurred vision.  Having feelings of numbness or tingling.  Having trouble talking.  Having muscle weakness. DIAGNOSIS  A migraine headache is often diagnosed based on:  Symptoms.  Physical exam.  A CT scan or MRI of your head. These imaging tests cannot diagnose migraines, but they can help rule out other causes of headaches. TREATMENT Medicines may be given for pain and nausea. Medicines can also be given to help prevent recurrent migraines.  HOME CARE INSTRUCTIONS  Only take over-the-counter or prescription medicines for pain or discomfort as directed by your  health care provider. The use of long-term narcotics is not recommended.  Lie down in a dark, quiet room when you have a migraine.  Keep a journal to find out what may trigger your migraine headaches. For example, write down:  What you eat and drink.  How much sleep you get.  Any change to your diet or medicines.  Limit alcohol consumption.  Quit smoking if you smoke.  Get 7 9 hours of sleep, or as recommended by your health care provider.  Limit stress.  Keep lights dim if bright lights bother you and make your migraines worse. SEEK IMMEDIATE MEDICAL CARE IF:   Your migraine becomes severe.  You have a fever.  You have a stiff neck.  You have vision loss.  You have muscular weakness or loss of muscle control.  You start losing your balance or have trouble walking.  You feel faint or pass out.  You have severe symptoms that are different from your first symptoms. MAKE SURE YOU:   Understand these instructions.  Will watch your condition.  Will get help right away if you are not doing well or get worse. Document Released: 11/23/2005 Document Revised: 09/13/2013 Document Reviewed: 07/31/2013 [employer] Patient Information 2014 [employer].  

## 2014-02-21 NOTE — ED Provider Notes (Signed)
CSN: [id number]     Arrival date & time 02/21/14  1132 History   First MD Initiated Contact with Patient 02/21/14 1149     Chief Complaint  Patient presents with  . Headache     (Consider location/radiation/quality/duration/timing/severity/associated sxs/prior Treatment) HPI  Patient presents with headache. Reports onset of headache on Sunday. She states that it comes and goes. It improves with Tylenol but "always comes back." No history of migraines. Patient reports a frontal throbbing headache. Currently pain is 4/10. She did not take any medication today. She denies any vision changes, photophobia, nausea, vomiting, or diarrhea. She denies any nasal congestion or recent URI symptoms. She denies any fever or neck stiffness. She is not currently on any birth control.  Past Medical History  Diagnosis Date  . Asthma    Past Surgical History  Procedure Laterality Date  . Anterior cruciate ligament repair     No family history on file. History  Substance Use Topics  . Smoking status: Former Games developermoker  . Smokeless tobacco: Never Used  . Alcohol Use: No   OB History   Grav Para Term Preterm Abortions TAB SAB Ect Mult Living                 Review of Systems  Constitutional: Negative for fever.  HENT: Negative for congestion.   Eyes: Negative for photophobia and visual disturbance.  Respiratory: Negative for chest tightness and shortness of breath.   Cardiovascular: Negative for chest pain.  Gastrointestinal: Negative for nausea and vomiting.  Genitourinary: Negative for dysuria.  Musculoskeletal: Negative for back pain and neck pain.  Skin: Negative for wound.  Neurological: Positive for headaches. Negative for dizziness, weakness, light-headedness and numbness.  All other systems reviewed and are negative.      Allergies  Flagyl and Ibuprofen  Home Medications   Current Outpatient Rx  Name  Route  Sig  Dispense  Refill  . Fluticasone Propionate, Inhal, (FLOVENT  IN)   Inhalation   Inhale into the lungs.         Marland Kitchen. ibuprofen (ADVIL,MOTRIN) 600 MG tablet   Oral   Take 1 tablet (600 mg total) by mouth every 6 (six) hours as needed for pain.   30 tablet   0   . metroNIDAZOLE (FLAGYL) 500 MG tablet   Oral   Take 1 tablet (500 mg total) by mouth 2 (two) times daily. One po bid x 7 days   14 tablet   0   . Montelukast Sodium (SINGULAIR PO)   Oral   Take by mouth.         . pirbuterol (MAXAIR) 200 MCG/INH inhaler   Inhalation   Inhale 2 puffs into the lungs 4 (four) times daily.         . sodium chloride (OCEAN) 0.65 % SOLN nasal spray   Each Nare   Place 1 spray into both nostrils as needed for congestion.   1 Bottle   0    BP 122/84  Pulse 68  Temp(Src) 98.6 F (37 C) (Oral)  Resp 18  SpO2 100%  LMP 02/04/2014 Physical Exam  Nursing note and vitals reviewed. Constitutional: She is oriented to person, place, and time. She appears well-developed and well-nourished. No distress.  HENT:  Head: Normocephalic and atraumatic.  Right Ear: External ear normal.  Left Ear: External ear normal.  Nose: Nose normal.  Mouth/Throat: Oropharynx is clear and moist.  Mild tenderness to palpation of the frontal sinus  Eyes: Pupils  are equal, round, and reactive to light.  Neck: Neck supple.  Cardiovascular: Normal rate and regular rhythm.   Pulmonary/Chest: Effort normal. No respiratory distress.  Musculoskeletal: She exhibits no edema.  Neurological: She is alert and oriented to person, place, and time.  5 out of 5 strength in all 4 extremities, coordination intact to finger-nose-finger  Skin: Skin is warm and dry.  Psychiatric: She has a normal mood and affect.    ED Course  Procedures (including critical care time) Labs Review Labs Reviewed - No data to display Imaging Review No results found.   EKG Interpretation None      MDM   Final diagnoses:  Headache   The patient presents with headache. Headache is improved  with Tylenol but she has not taken anything today. She is nontoxic and nonfocal on exam. She does have tenderness to palpation over the frontal sinus and has a history of allergies which could explain headache. Patient was given Toradol. She states she has an intolerance to ibuprofen. Patient reports improvement with Toradol. Have encouraged patient to use Tylenol, nasal saline, and Singulair as needed. No evidence of fever or meningismus.  After history, exam, and medical workup I feel the patient has been appropriately medically screened and is safe for discharge home. Pertinent diagnoses were discussed with the patient. Patient was given return precautions.     Shon Baton, MD 02/21/14 [phone]

## 2014-02-21 NOTE — ED Notes (Signed)
Pt c/o headache intermittent x 4 days. Pt sts she took tylenol with some relief but did not take any today. Pt denies n/v/d.

## 2014-03-07 ENCOUNTER — Encounter: Payer: Self-pay | Admitting: Obstetrics & Gynecology

## 2014-03-17 ENCOUNTER — Emergency Department (HOSPITAL_BASED_OUTPATIENT_CLINIC_OR_DEPARTMENT_OTHER)
Admission: EM | Admit: 2014-03-17 | Discharge: 2014-03-17 | Discharge status: Against Medical Advice | Payer: Self-pay | Attending: Emergency Medicine | Admitting: Emergency Medicine

## 2014-03-17 ENCOUNTER — Encounter (HOSPITAL_BASED_OUTPATIENT_CLINIC_OR_DEPARTMENT_OTHER): Payer: Self-pay | Admitting: Emergency Medicine

## 2014-03-17 DIAGNOSIS — R1110 Vomiting, unspecified: Secondary | ICD-10-CM

## 2014-03-17 DIAGNOSIS — R61 Generalized hyperhidrosis: Secondary | ICD-10-CM | POA: Insufficient documentation

## 2014-03-17 DIAGNOSIS — R112 Nausea with vomiting, unspecified: Secondary | ICD-10-CM | POA: Insufficient documentation

## 2014-03-17 DIAGNOSIS — Z792 Long term (current) use of antibiotics: Secondary | ICD-10-CM | POA: Insufficient documentation

## 2014-03-17 DIAGNOSIS — J45909 Unspecified asthma, uncomplicated: Secondary | ICD-10-CM | POA: Insufficient documentation

## 2014-03-17 DIAGNOSIS — Z87891 Personal history of nicotine dependence: Secondary | ICD-10-CM | POA: Insufficient documentation

## 2014-03-17 DIAGNOSIS — Z79899 Other long term (current) drug therapy: Secondary | ICD-10-CM | POA: Insufficient documentation

## 2014-03-17 DIAGNOSIS — R55 Syncope and collapse: Secondary | ICD-10-CM | POA: Insufficient documentation

## 2014-03-17 DIAGNOSIS — IMO0002 Reserved for concepts with insufficient information to code with codable children: Secondary | ICD-10-CM | POA: Insufficient documentation

## 2014-03-17 DIAGNOSIS — Z3202 Encounter for pregnancy test, result negative: Secondary | ICD-10-CM | POA: Insufficient documentation

## 2014-03-17 LAB — URINE MICROSCOPIC-ADD ON

## 2014-03-17 LAB — URINALYSIS, ROUTINE W REFLEX MICROSCOPIC
Glucose, UA: NEGATIVE mg/dL
HGB URINE DIPSTICK: NEGATIVE
Ketones, ur: >80 mg/dL — AB
Nitrite: NEGATIVE
PH: 6 (ref 5.0–8.0)
Protein, ur: 30 mg/dL — AB
SPECIFIC GRAVITY, URINE: 1.03 (ref 1.005–1.030)
UROBILINOGEN UA: 1 mg/dL (ref 0.0–1.0)

## 2014-03-17 LAB — BASIC METABOLIC PANEL
BUN: 11 mg/dL (ref 6–23)
CO2: 24 meq/L (ref 19–32)
CREATININE: 1 mg/dL (ref 0.50–1.10)
Calcium: 9.7 mg/dL (ref 8.4–10.5)
Chloride: 104 mEq/L (ref 96–112)
GFR calc Af Amer: >90 mL/min (ref >90)
GFR calc non Af Amer: 79 mL/min — ABNORMAL LOW (ref >90)
Glucose, Bld: 87 mg/dL (ref 70–99)
POTASSIUM: 3.9 meq/L (ref 3.7–5.3)
Sodium: 141 mEq/L (ref 137–147)

## 2014-03-17 LAB — PREGNANCY, URINE: Preg Test, Ur: NEGATIVE

## 2014-03-17 MED ORDER — ONDANSETRON 4 MG PO TBDP
ORAL_TABLET | ORAL | Status: AC
  Administered 2014-03-17: 4 mg
  Filled 2014-03-17: qty 1

## 2014-03-17 MED ORDER — ONDANSETRON HCL 8 MG PO TABS: 4.0000 mg | ORAL_TABLET | Freq: Once | ORAL | Status: AC

## 2014-03-17 NOTE — ED Notes (Signed)
Pt reported to staff that she was ready to go.  Informed S. Upstill, PA.  On going back to room, patient had left the room and was not in the immediate premises.  S. Upstill, PA aware.

## 2014-03-17 NOTE — ED Notes (Signed)
Reports syncopal episode yesterday (felt dizzy, double vision, the passed out).  Woke up vomiting.  Was sent home from work. Vomiting has continued today.  Reported diarrhea yesterday.  Denies fever.

## 2014-03-17 NOTE — ED Notes (Signed)
C/o left side pain from where she fell yesterday after passing out.

## 2014-03-17 NOTE — ED Provider Notes (Signed)
CSN: [id number]     Arrival date & time 03/17/14  1253 History   First MD Initiated Contact with Patient 03/17/14 1311     Chief Complaint  Patient presents with  . Emesis     (Consider location/radiation/quality/duration/timing/severity/associated sxs/prior Treatment) Patient is a 23 y.o. female presenting with vomiting. The history is provided by the patient. No language interpreter was used.  Emesis Severity:  Mild Duration:  2 days Associated symptoms: no abdominal pain, no diarrhea, no headaches and no myalgias   Associated symptoms comment:  She states that last night around 8:00 p.m., while at work, she felt hot, broke out into a sweat and had a brief syncopal episode. She denies pre-syncopal chest pain or palpitations, has not been ill recently, does not have a history of syncope in the past and denies headache. She denies history of anemia and her recent menses was on schedule and unchanged from her usual flow. She was out for a minute or less and had a vomiting episode x 1 when she woke up. She went home, hydrated herself without further vomiting and went to bed. When she woke this morning the vomiting recurred and she presents after having 3-4 episodes today. No fever or diarrhea.    Past Medical History  Diagnosis Date  . Asthma    Past Surgical History  Procedure Laterality Date  . Anterior cruciate ligament repair     No family history on file. History  Substance Use Topics  . Smoking status: Former Games developermoker  . Smokeless tobacco: Never Used  . Alcohol Use: No   OB History   Grav Para Term Preterm Abortions TAB SAB Ect Mult Living                 Review of Systems  Constitutional: Negative for fever.  HENT: Negative.   Respiratory: Negative.   Cardiovascular: Negative.   Gastrointestinal: Positive for nausea and vomiting. Negative for abdominal pain and diarrhea.       No hematemesis.  Genitourinary: Negative for dysuria, vaginal bleeding, vaginal discharge and  menstrual problem.  Musculoskeletal: Negative for myalgias.  Skin: Negative.  Negative for wound.  Neurological: Positive for syncope. Negative for headaches.      Allergies  Flagyl and Ibuprofen  Home Medications   Current Outpatient Rx  Name  Route  Sig  Dispense  Refill  . Fluticasone Propionate, Inhal, (FLOVENT IN)   Inhalation   Inhale into the lungs.         Marland Kitchen. ibuprofen (ADVIL,MOTRIN) 600 MG tablet   Oral   Take 1 tablet (600 mg total) by mouth every 6 (six) hours as needed for pain.   30 tablet   0   . metroNIDAZOLE (FLAGYL) 500 MG tablet   Oral   Take 1 tablet (500 mg total) by mouth 2 (two) times daily. One po bid x 7 days   14 tablet   0   . Montelukast Sodium (SINGULAIR PO)   Oral   Take by mouth.         . pirbuterol (MAXAIR) 200 MCG/INH inhaler   Inhalation   Inhale 2 puffs into the lungs 4 (four) times daily.         . sodium chloride (OCEAN) 0.65 % SOLN nasal spray   Each Nare   Place 1 spray into both nostrils as needed for congestion.   1 Bottle   0    BP 107/79  Pulse 76  Temp(Src) 98.4 F (36.9 C) (Oral)  Resp 20  Ht 5\' 3"  (1.6 m)  Wt 125 lb (56.7 kg)  BMI 22.15 kg/m2  SpO2 100%  LMP 03/06/2014 Physical Exam  Constitutional: She is oriented to person, place, and time. She appears well-developed and well-nourished.  HENT:  Head: Normocephalic.  Mouth/Throat: Oropharynx is clear and moist.  Eyes: Conjunctivae are normal.  No conjunctival pallor.  Neck: Normal range of motion. Neck supple.  Cardiovascular: Normal rate and regular rhythm.   Pulmonary/Chest: Effort normal and breath sounds normal.  Abdominal: Soft. Bowel sounds are normal. There is no tenderness. There is no rebound and no guarding.  Musculoskeletal: Normal range of motion.  Neurological: She is alert and oriented to person, place, and time.  CN 3-12 grossly intact. She ambulates without imbalance. She is focused on exam, follows commands and has normal  coordination.  Skin: Skin is warm and dry. No rash noted.  Psychiatric: She has a normal mood and affect.    ED Course  Procedures (including critical care time) Labs Review Labs Reviewed  BASIC METABOLIC PANEL  PREGNANCY, URINE  URINALYSIS, ROUTINE W REFLEX MICROSCOPIC   Imaging Review No results found.   EKG Interpretation None      MDM   Final diagnoses:  None    1. Syncope 2. Vomiting  The patient had no further vomiting in ED. IV fluids given, labs performed. EKG sinus. She left AMA prior to re-evaluation and discharge.     Arnoldo Hooker, PA-C 03/18/14 1839

## 2014-03-19 NOTE — ED Provider Notes (Signed)
Medical screening examination/treatment/procedure(s) were performed by non-physician practitioner and as supervising physician I was immediately available for consultation/collaboration.   EKG Interpretation   Date/Time:  Saturday March 17 2014 13:56:23 EDT Ventricular Rate:  69 PR Interval:  148 QRS Duration: 72 QT Interval:  394 QTC Calculation: 422 R Axis:   73 Text Interpretation:  Sinus rhythm with marked sinus arrhythmia Otherwise  normal ECG No previous tracing Confirmed by Carolinas RehabilitationGHIM  MD, MICHEAL ([id number]) on  03/17/2014 2:23:17 PM        Gavin PoundMichael Y. Oletta LamasGhim, MD 03/19/14 2052

## 2014-05-24 ENCOUNTER — Encounter (HOSPITAL_BASED_OUTPATIENT_CLINIC_OR_DEPARTMENT_OTHER): Payer: Self-pay | Admitting: Emergency Medicine

## 2014-05-24 ENCOUNTER — Emergency Department (HOSPITAL_BASED_OUTPATIENT_CLINIC_OR_DEPARTMENT_OTHER): Payer: Self-pay

## 2014-05-24 ENCOUNTER — Emergency Department (HOSPITAL_BASED_OUTPATIENT_CLINIC_OR_DEPARTMENT_OTHER)
Admission: EM | Admit: 2014-05-24 | Discharge: 2014-05-24 | Disposition: A | Discharge status: Home / Self Care | Payer: Self-pay | Attending: Emergency Medicine | Admitting: Emergency Medicine

## 2014-05-24 DIAGNOSIS — Z3202 Encounter for pregnancy test, result negative: Secondary | ICD-10-CM | POA: Insufficient documentation

## 2014-05-24 DIAGNOSIS — Z87891 Personal history of nicotine dependence: Secondary | ICD-10-CM | POA: Insufficient documentation

## 2014-05-24 DIAGNOSIS — Z79899 Other long term (current) drug therapy: Secondary | ICD-10-CM | POA: Insufficient documentation

## 2014-05-24 DIAGNOSIS — J45909 Unspecified asthma, uncomplicated: Secondary | ICD-10-CM | POA: Insufficient documentation

## 2014-05-24 DIAGNOSIS — Y929 Unspecified place or not applicable: Secondary | ICD-10-CM | POA: Insufficient documentation

## 2014-05-24 DIAGNOSIS — S92919A Unspecified fracture of unspecified toe(s), initial encounter for closed fracture: Secondary | ICD-10-CM | POA: Insufficient documentation

## 2014-05-24 DIAGNOSIS — IMO0002 Reserved for concepts with insufficient information to code with codable children: Secondary | ICD-10-CM | POA: Insufficient documentation

## 2014-05-24 DIAGNOSIS — Y9389 Activity, other specified: Secondary | ICD-10-CM | POA: Insufficient documentation

## 2014-05-24 DIAGNOSIS — S92403A Displaced unspecified fracture of unspecified great toe, initial encounter for closed fracture: Secondary | ICD-10-CM

## 2014-05-24 DIAGNOSIS — Z792 Long term (current) use of antibiotics: Secondary | ICD-10-CM | POA: Insufficient documentation

## 2014-05-24 MED ORDER — ACETAMINOPHEN 325 MG PO TABS
650.0000 mg | ORAL_TABLET | Freq: Once | ORAL | Status: AC
  Administered 2014-05-24: 650 mg via ORAL
  Filled 2014-05-24: qty 2

## 2014-05-24 MED ORDER — HYDROCODONE-ACETAMINOPHEN 5-325 MG PO TABS: 1.0000 | ORAL_TABLET | ORAL | Status: DC | PRN

## 2014-05-24 MED ORDER — HYDROCODONE-ACETAMINOPHEN 5-325 MG PO TABS
1.0000 | ORAL_TABLET | Freq: Once | ORAL | Status: AC
  Administered 2014-05-24: 1 via ORAL
  Filled 2014-05-24: qty 1

## 2014-05-24 NOTE — ED Provider Notes (Signed)
CSN: [id number]     Arrival date & time 05/24/14  1511 History   First MD Initiated Contact with Patient 05/24/14 1518     Chief Complaint  Patient presents with  . Toe Injury     (Consider location/radiation/quality/duration/timing/severity/associated sxs/prior Treatment) HPI [place] of [place] right great toe pain after she kicked a kickball one hour ago with her right great toe while wearing slippers. No other complaint no treatment prior to coming here pain is nonradiating, moderate at present. No treatment prior to coming here pain is worse with pressing on the area. Not improved by anything. Past Medical History  Diagnosis Date  . Asthma    Past Surgical History  Procedure Laterality Date  . Anterior cruciate ligament repair     No family history on file. History  Substance Use Topics  . Smoking status: Former Games developermoker  . Smokeless tobacco: Never Used  . Alcohol Use: No   OB History   Grav Para Term Preterm Abortions TAB SAB Ect Mult Living                 Review of Systems  Constitutional: Negative.   Musculoskeletal: Positive for arthralgias.       Pain at right great toe   Hematological: Negative.       Allergies  Flagyl and Ibuprofen  Home Medications   Prior to Admission medications   Medication Sig Start Date End Date Taking? Authorizing Provider  Fluticasone Propionate, Inhal, (FLOVENT IN) Inhale into the lungs.    Historical Provider, MD  ibuprofen (ADVIL,MOTRIN) 600 MG tablet Take 1 tablet (600 mg total) by mouth every 6 (six) hours as needed for pain. 05/09/13   Gavin PoundMichael Y. Ghim, MD  metroNIDAZOLE (FLAGYL) 500 MG tablet Take 1 tablet (500 mg total) by mouth 2 (two) times daily. One po bid x 7 days 10/30/13   Carlisle BeersJohn L Molpus, MD  Montelukast Sodium (SINGULAIR PO) Take by mouth.    Historical Provider, MD  pirbuterol (MAXAIR) 200 MCG/INH inhaler Inhale 2 puffs into the lungs 4 (four) times daily.    Historical Provider, MD  sodium chloride (OCEAN) 0.65 % SOLN  nasal spray Place 1 spray into both nostrils as needed for congestion. 12/29/13   Shon Batonourtney F Horton, MD   BP 129/91  Pulse 72  Temp(Src) 98.4 F (36.9 C) (Oral)  Resp 16  Ht 5\' 3"  (1.6 m)  Wt 115 lb (52.164 kg)  BMI 20.38 kg/m2  SpO2 100%  LMP 05/07/2014 Physical Exam  Nursing note and vitals reviewed. Constitutional: She appears well-developed and well-nourished. No distress.  HENT:  Head: Normocephalic and atraumatic.  Right Ear: External ear normal.  Left Ear: External ear normal.  Nose: Nose normal.  Eyes: EOM are normal.  Neck: Neck supple.  Cardiovascular: Normal rate.   Pulmonary/Chest: Effort normal.  Abdominal: She exhibits no distension.  Musculoskeletal: She exhibits no edema.  Right lower extremity foot is without edema. Great toe is minimally plantar flexed. No edema. No subungual hematoma. Tender diffusely minimally. Good capillary refill otherwise atraumatic. All other extremities without redness swelling or tenderness neurovascular intact    ED Course  Procedures (including critical care time) Labs Review Labs Reviewed - No data to display  Imaging Review No results found.   EKG Interpretation None     X-ray viewed by me.  Results for orders placed during the hospital encounter of 03/17/14  BASIC METABOLIC PANEL      Result Value Ref Range   Sodium 141  137 - 147 mEq/L   Potassium 3.9  3.7 - 5.3 mEq/L   Chloride 104  96 - 112 mEq/L   CO2 24  19 - 32 mEq/L   Glucose, Bld 87  70 - 99 mg/dL   BUN 11  6 - 23 mg/dL   Creatinine, Ser 2.131.00  0.50 - 1.10 mg/dL   Calcium 9.7  8.4 - 08.610.5 mg/dL   GFR calc non Af Amer 79 (*) >90 mL/min   GFR calc Af Amer >90  >90 mL/min  PREGNANCY, URINE      Result Value Ref Range   Preg Test, Ur NEGATIVE  NEGATIVE  URINALYSIS, ROUTINE W REFLEX MICROSCOPIC      Result Value Ref Range   Color, Urine AMBER (*) YELLOW   APPearance CLOUDY (*) CLEAR   Specific Gravity, Urine 1.030  1.005 - 1.030   pH 6.0  5.0 - 8.0    Glucose, UA NEGATIVE  NEGATIVE mg/dL   Hgb urine dipstick NEGATIVE  NEGATIVE   Bilirubin Urine SMALL (*) NEGATIVE   Ketones, ur >80 (*) NEGATIVE mg/dL   Protein, ur 30 (*) NEGATIVE mg/dL   Urobilinogen, UA 1.0  0.0 - 1.0 mg/dL   Nitrite NEGATIVE  NEGATIVE   Leukocytes, UA SMALL (*) NEGATIVE  URINE MICROSCOPIC-ADD ON      Result Value Ref Range   Squamous Epithelial / LPF FEW (*) RARE   WBC, UA 3-6  <3 WBC/hpf   Bacteria, UA FEW (*) RARE   Urine-Other MUCOUS PRESENT     Dg Toe Great Right  05/24/2014   CLINICAL DATA:  Trauma.  EXAM: RIGHT GREAT TOE  COMPARISON:  None.  FINDINGS: Complex fracture of the base of the right great toe distal phalanx. The fracture extends into the interphalangeal joint space.  IMPRESSION: Comminuted complex fracture of the base of the of right first distal phalanx with extension into the interphalangeal joint. Mild fracture fragment displacement present.   Electronically Signed   By: Maisie Fushomas  Register   On: 05/24/2014 15:45    4 PM pain not improved after treatment with Tylenol. Norco ordered.  MDM  Plan postop shoe, crutches, prescription Norco, referral to Dr.Hunall,, orthopedics Final diagnoses:  None   diagnosis closed fracture right great toe      Doug SouSam Jacubowitz, MD 05/24/14 1604

## 2014-05-24 NOTE — Discharge Instructions (Signed)
Crutch Use Take Tylenol for mild pain or the pain medicine prescribed for bad pain. Call Dr.Hudnall tomorrow to schedule appointment for within a week. Use crutches as needed. Wear the postop shoe for comfort. Crutches take weight off one of your legs or feet when you stand or walk. It is important to use crutches that fit right. Your crutches fit right if:  You can fit 2-3 fingers between your armpit and the crutch.  You use your hands, not your armpits, to hold yourself up. Do not put your armpits on the crutches. This can damage the nerves in your hands and arms. Crutches should be a little below your armpits. HOW TO USE YOUR CRUTCHES Walking 1. Step with the crutches. 2. Swing the good leg a little bit in front of the crutches. Going Up Steps If there is no handrail: 1. Step up with the good leg. 2. Step up with the crutches and hurt leg. 3. Continue in this way. If there is a handrail: 1. Hold both crutches in one hand. 2. Place your free hand on the handrail. 3. Put your weight on your arms and lift your good leg to the step. 4. Bring the crutches and the hurt leg up to that step. 5. Continue in this way. Going Down Steps Be very careful, as going down stairs with crutches is very challenging. If there is no handrail: 1. Step down with the hurt leg and crutches. 2. Step down with the good leg. If there is a handrail: 1. Place your hand on the handrail. 2. Hold both crutches with your free hand. 3. Lower your hurt leg and crutch to the step below you. Make sure to keep the crutch tips in the center of the step, never on the edge. 4. Lower your good leg to that step. 5. Continue in this way. Standing Up 1. Hold the hurt leg forward. 2. Grab the armrest with one hand and the top of the crutches with the other hand. 3. Pull yourself up to a standing position. Sitting Down 1. Hold the hurt leg forward. 2. Grab the armrest with one hand and the top of the crutches with the  other hand. 3.  Lower yourself to a sitting position. GET HELP IF:  You still feel wobbly on your feet.  You develop new pain, for example in your armpits, back, shoulder, wrist, or hip.  You cannot feel a part of your body (numb).  You have tingling. GET HELP RIGHT AWAY IF: You fall. Document Released: 05/11/2008 Document Revised: 09/13/2013 Document Reviewed: 07/31/2013 [facility] Patient Information 2015 [employer], [place]. This information is not intended to replace advice given to you by your health care provider. Make sure you discuss any questions you have with your health care provider.

## 2014-05-24 NOTE — ED Notes (Signed)
Pt. Reports she kicked a kickball with her slippers on causing injury to the R great toe.  Edema noted and Pt. Reports pain in the R great toe at the tip end.

## 2014-05-24 NOTE — ED Notes (Signed)
MD at bedside. 

## 2014-06-18 ENCOUNTER — Encounter (HOSPITAL_BASED_OUTPATIENT_CLINIC_OR_DEPARTMENT_OTHER): Payer: Self-pay | Admitting: Emergency Medicine

## 2014-06-18 ENCOUNTER — Emergency Department (HOSPITAL_BASED_OUTPATIENT_CLINIC_OR_DEPARTMENT_OTHER)
Admission: EM | Admit: 2014-06-18 | Discharge: 2014-06-18 | Disposition: A | Discharge status: Home / Self Care | Payer: Self-pay | Attending: Emergency Medicine | Admitting: Emergency Medicine

## 2014-06-18 DIAGNOSIS — Z79899 Other long term (current) drug therapy: Secondary | ICD-10-CM | POA: Insufficient documentation

## 2014-06-18 DIAGNOSIS — Z792 Long term (current) use of antibiotics: Secondary | ICD-10-CM | POA: Insufficient documentation

## 2014-06-18 DIAGNOSIS — J029 Acute pharyngitis, unspecified: Secondary | ICD-10-CM | POA: Insufficient documentation

## 2014-06-18 DIAGNOSIS — Z87891 Personal history of nicotine dependence: Secondary | ICD-10-CM | POA: Insufficient documentation

## 2014-06-18 DIAGNOSIS — J45909 Unspecified asthma, uncomplicated: Secondary | ICD-10-CM | POA: Insufficient documentation

## 2014-06-18 DIAGNOSIS — IMO0002 Reserved for concepts with insufficient information to code with codable children: Secondary | ICD-10-CM | POA: Insufficient documentation

## 2014-06-18 LAB — RAPID STREP SCREEN (MED CTR MEBANE ONLY): STREPTOCOCCUS, GROUP A SCREEN (DIRECT): NEGATIVE

## 2014-06-18 NOTE — ED Notes (Signed)
NP at bedside.

## 2014-06-18 NOTE — Discharge Instructions (Signed)
Pharyngitis °Pharyngitis is redness, pain, and swelling (inflammation) of your pharynx.  °CAUSES  °Pharyngitis is usually caused by infection. Most of the time, these infections are from viruses (viral) and are part of a cold. However, sometimes pharyngitis is caused by bacteria (bacterial). Pharyngitis can also be caused by allergies. Viral pharyngitis may be spread from person to person by coughing, sneezing, and personal items or utensils (cups, forks, spoons, toothbrushes). Bacterial pharyngitis may be spread from person to person by more intimate contact, such as kissing.  °SIGNS AND SYMPTOMS  °Symptoms of pharyngitis include:   °· Sore throat.   °· Tiredness (fatigue).   °· Low-grade fever.   °· Headache. °· Joint pain and muscle aches. °· Skin rashes. °· Swollen lymph nodes. °· Plaque-like film on throat or tonsils (often seen with bacterial pharyngitis). °DIAGNOSIS  °Your health care provider will ask you questions about your illness and your symptoms. Your medical history, along with a physical exam, is often all that is needed to diagnose pharyngitis. Sometimes, a rapid strep test is done. Other lab tests may also be done, depending on the suspected cause.  °TREATMENT  °Viral pharyngitis will usually get better in 3-4 days without the use of medicine. Bacterial pharyngitis is treated with medicines that kill germs (antibiotics).  °HOME CARE INSTRUCTIONS  °· Drink enough water and fluids to keep your urine clear or pale yellow.   °· Only take over-the-counter or prescription medicines as directed by your health care provider:   °¨ If you are prescribed antibiotics, make sure you finish them even if you start to feel better.   °¨ Do not take aspirin.   °· Get lots of rest.   °· Gargle with 8 oz of salt water (½ tsp of salt per 1 qt of water) as often as every 1-2 hours to soothe your throat.   °· Throat lozenges (if you are not at risk for choking) or sprays may be used to soothe your throat. °SEEK MEDICAL  CARE IF:  °· You have large, tender lumps in your neck. °· You have a rash. °· You cough up green, yellow-brown, or bloody spit. °SEEK IMMEDIATE MEDICAL CARE IF:  °· Your neck becomes stiff. °· You drool or are unable to swallow liquids. °· You vomit or are unable to keep medicines or liquids down. °· You have severe pain that does not go away with the use of recommended medicines. °· You have trouble breathing (not caused by a stuffy nose). °MAKE SURE YOU:  °· Understand these instructions. °· Will watch your condition. °· Will get help right away if you are not doing well or get worse. °Document Released: 11/23/2005 Document Revised: 09/13/2013 Document Reviewed: 07/31/2013 °ExitCare® Patient Information ©2015 [employer] LLC. This information is not intended to replace advice given to you by your health care provider. Make sure you discuss any questions you have with your health care provider. ° °Salt Water Gargle °This solution will help make your mouth and throat feel better. °HOME CARE INSTRUCTIONS  °· Mix 1 teaspoon of salt in 8 ounces of warm water. °· Gargle with this solution as much or often as you need or as directed. Swish and gargle gently if you have any sores or wounds in your mouth. °· Do not swallow this mixture. °Document Released: 08/27/2004 Document Revised: 02/15/2012 Document Reviewed: 01/18/2009 °ExitCare® Patient Information ©2015 [employer], LLC. This information is not intended to replace advice given to you by your health care provider. Make sure you discuss any questions you have with your health care provider. ° °

## 2014-06-18 NOTE — ED Provider Notes (Signed)
Medical screening examination/treatment/procedure(s) were performed by non-physician practitioner and as supervising physician I was immediately available for consultation/collaboration.  Emalynn Clewis E Kiylah Loyer, MD 06/18/14 2347 

## 2014-06-18 NOTE — ED Notes (Signed)
Pt c/o sore throat x 1 week, difficulty swallowing food

## 2014-06-18 NOTE — ED Provider Notes (Signed)
CSN: [id number]     Arrival date & time 06/18/14  1955 History   First MD Initiated Contact with Patient 06/18/14 2050     Chief Complaint  Patient presents with  . Sore Throat     (Consider location/radiation/quality/duration/timing/severity/associated sxs/prior Treatment) Patient is a 23 y.o. female presenting with pharyngitis. The history is provided by the patient. No language interpreter was used.  Sore Throat This is a new problem. The current episode started in the past 7 days. The problem occurs constantly. The problem has been unchanged. Pertinent negatives include no coughing, fever, neck pain or vomiting. The symptoms are aggravated by swallowing. She has tried nothing for the symptoms.    Past Medical History  Diagnosis Date  . Asthma    Past Surgical History  Procedure Laterality Date  . Anterior cruciate ligament repair     History reviewed. No pertinent family history. History  Substance Use Topics  . Smoking status: Former Games developer  . Smokeless tobacco: Never Used  . Alcohol Use: No   OB History   Grav Para Term Preterm Abortions TAB SAB Ect Mult Living                 Review of Systems  Constitutional: Negative for fever.  Respiratory: Negative for cough.   Cardiovascular: Negative.   Gastrointestinal: Negative for vomiting.  Musculoskeletal: Negative for neck pain.      Allergies  Flagyl and Ibuprofen  Home Medications   Prior to Admission medications   Medication Sig Start Date End Date Taking? Authorizing Provider  Fluticasone Propionate, Inhal, (FLOVENT IN) Inhale into the lungs.    Historical Provider, MD  HYDROcodone-acetaminophen (NORCO/VICODIN) 5-325 MG per tablet Take 1 tablet by mouth every 4 (four) hours as needed for moderate pain or severe pain. 05/24/14   Doug Sou, MD  ibuprofen (ADVIL,MOTRIN) 600 MG tablet Take 1 tablet (600 mg total) by mouth every 6 (six) hours as needed for pain. 05/09/13   Gavin Pound. Ghim, MD  metroNIDAZOLE  (FLAGYL) 500 MG tablet Take 1 tablet (500 mg total) by mouth 2 (two) times daily. One po bid x 7 days 10/30/13   Carlisle Beers Molpus, MD  Montelukast Sodium (SINGULAIR PO) Take by mouth.    Historical Provider, MD  pirbuterol (MAXAIR) 200 MCG/INH inhaler Inhale 2 puffs into the lungs 4 (four) times daily.    Historical Provider, MD  sodium chloride (OCEAN) 0.65 % SOLN nasal spray Place 1 spray into both nostrils as needed for congestion. 12/29/13   Shon Baton, MD   BP 125/84  Pulse 64  Temp(Src) 98.3 F (36.8 C) (Oral)  Resp 16  Ht 5\' 3"  (1.6 m)  Wt 115 lb (52.164 kg)  BMI 20.38 kg/m2  SpO2 99%  LMP 06/07/2014 Physical Exam  Nursing note and vitals reviewed. Constitutional: She is oriented to person, place, and time. She appears well-developed and well-nourished.  HENT:  Head: Normocephalic and atraumatic.  Right Ear: External ear normal.  Left Ear: External ear normal.  Mouth/Throat: Oropharynx is clear and moist.  Neck: Normal range of motion. Neck supple.  Cardiovascular: Normal rate and regular rhythm.   Pulmonary/Chest: Effort normal and breath sounds normal.  Abdominal: Soft. There is no tenderness.  Neurological: She is alert and oriented to person, place, and time.  Skin: Skin is dry. No rash noted.  Psychiatric: She has a normal mood and affect.    ED Course  Procedures (including critical care time) Labs Review Labs Reviewed  RAPID  STREP SCREEN  CULTURE, GROUP A STREP    Imaging Review No results found.   EKG Interpretation None      MDM   Final diagnoses:  Pharyngitis    Strep negative. Discussed symptomatic treatment with pt at home    Teressa LowerVrinda Shaylinn Hladik, NP 06/18/14 2345

## 2014-06-20 LAB — CULTURE, GROUP A STREP

## 2014-08-28 ENCOUNTER — Encounter (HOSPITAL_BASED_OUTPATIENT_CLINIC_OR_DEPARTMENT_OTHER): Payer: Self-pay | Admitting: Emergency Medicine

## 2014-08-28 ENCOUNTER — Emergency Department (HOSPITAL_BASED_OUTPATIENT_CLINIC_OR_DEPARTMENT_OTHER): Payer: Self-pay

## 2014-08-28 ENCOUNTER — Emergency Department (HOSPITAL_BASED_OUTPATIENT_CLINIC_OR_DEPARTMENT_OTHER)
Admission: EM | Admit: 2014-08-28 | Discharge: 2014-08-28 | Disposition: A | Discharge status: Home / Self Care | Payer: Self-pay | Attending: Emergency Medicine | Admitting: Emergency Medicine

## 2014-08-28 DIAGNOSIS — R51 Headache: Secondary | ICD-10-CM | POA: Insufficient documentation

## 2014-08-28 DIAGNOSIS — J069 Acute upper respiratory infection, unspecified: Secondary | ICD-10-CM | POA: Insufficient documentation

## 2014-08-28 DIAGNOSIS — R0981 Nasal congestion: Secondary | ICD-10-CM

## 2014-08-28 DIAGNOSIS — J45901 Unspecified asthma with (acute) exacerbation: Secondary | ICD-10-CM | POA: Insufficient documentation

## 2014-08-28 DIAGNOSIS — Z79899 Other long term (current) drug therapy: Secondary | ICD-10-CM | POA: Insufficient documentation

## 2014-08-28 DIAGNOSIS — R059 Cough, unspecified: Secondary | ICD-10-CM

## 2014-08-28 DIAGNOSIS — R0602 Shortness of breath: Secondary | ICD-10-CM | POA: Insufficient documentation

## 2014-08-28 DIAGNOSIS — R05 Cough: Secondary | ICD-10-CM

## 2014-08-28 MED ORDER — ALBUTEROL SULFATE (2.5 MG/3ML) 0.083% IN NEBU
5.0000 mg | INHALATION_SOLUTION | Freq: Once | RESPIRATORY_TRACT | Status: AC
  Administered 2014-08-28: 5 mg via RESPIRATORY_TRACT
  Filled 2014-08-28: qty 6

## 2014-08-28 MED ORDER — FLUTICASONE PROPIONATE 50 MCG/ACT NA SUSP: 2.0000 | Freq: Every day | NASAL | Status: DC

## 2014-08-28 MED ORDER — ALBUTEROL SULFATE HFA 108 (90 BASE) MCG/ACT IN AERS
2.0000 | INHALATION_SPRAY | Freq: Once | RESPIRATORY_TRACT | Status: AC
  Administered 2014-08-28: 2 via RESPIRATORY_TRACT
  Filled 2014-08-28: qty 6.7

## 2014-08-28 MED ORDER — GUAIFENESIN ER 600 MG PO TB12: 600.0000 mg | ORAL_TABLET | Freq: Two times a day (BID) | ORAL | Status: DC | PRN

## 2014-08-28 MED ORDER — DEXAMETHASONE SODIUM PHOSPHATE 10 MG/ML IJ SOLN: 10.0000 mg | Freq: Once | INTRAMUSCULAR | Status: DC

## 2014-08-28 MED ORDER — DEXAMETHASONE SODIUM PHOSPHATE 4 MG/ML IJ SOLN
INTRAMUSCULAR | Status: AC
  Filled 2014-08-28: qty 3

## 2014-08-28 MED ORDER — SODIUM CHLORIDE-SODIUM BICARB 2300-700 MG NA KIT: 1.0000 "application " | PACK | Freq: Three times a day (TID) | NASAL | Status: DC | PRN

## 2014-08-28 MED ORDER — IPRATROPIUM BROMIDE 0.02 % IN SOLN
0.5000 mg | Freq: Once | RESPIRATORY_TRACT | Status: AC
  Administered 2014-08-28: 0.5 mg via RESPIRATORY_TRACT
  Filled 2014-08-28: qty 2.5

## 2014-08-28 MED ORDER — AEROCHAMBER PLUS W/MASK MISC
1.0000 | Freq: Once | Status: DC
  Filled 2014-08-28: qty 1

## 2014-08-28 NOTE — Progress Notes (Signed)
Patient was ambulated around the department twice.  Patient's SPO2 remained between 99% and 100%

## 2014-08-28 NOTE — ED Provider Notes (Signed)
CSN: [id number]     Arrival date & time 08/28/14  1757 History   First MD Initiated Contact with Patient 08/28/14 1834     Chief Complaint  Patient presents with  . URI     (Consider location/radiation/quality/duration/timing/severity/associated sxs/prior Treatment) HPI Comments: Kimberly Golden is a 23 y.o. female with a PMHx of asthma, who presents to the ED with complaints of URI symptoms including productive cough (greenish sputum), SOB, greenish rhinorrhea, sinus pressure/frontal HA (7/10 constant nonradiating pressure-like without aggravating or alleviating factors) for 6 days. States her sister was sick with a URI recently. Reports using alka selzter with relief of symptoms, but cough/SOB worsens with exertion. Endorses some chills which resolved 2 days ago. Denies wheezing, fevers, sore throat, ear or eye symptoms, CP, hemoptysis, abd pain, n/v/d, myalgias, arthralgias, syncope, lightheadedness, or vertigo. States she has not had an inhaler in a while due to cost concerns. Denies known allergies. Denies smoking.   Patient is a 23 y.o. female presenting with URI. The history is provided by the patient. No language interpreter was used.  URI Presenting symptoms: congestion, cough and rhinorrhea   Presenting symptoms: no ear pain, no facial pain, no fatigue, no fever and no sore throat   Congestion:    Location:  Nasal and chest   Interferes with sleep: yes     Interferes with eating/drinking: no   Cough:    Cough characteristics:  Productive   Sputum characteristics:  Green   Severity:  Moderate   Onset quality:  Gradual   Duration:  6 days   Timing:  Constant   Progression:  Worsening   Chronicity:  Recurrent Rhinorrhea:    Quality:  Green   Severity:  Mild   Duration:  6 days   Timing:  Constant   Progression:  Unchanged Severity:  Moderate Onset quality:  Gradual Duration:  6 days Timing:  Constant Progression:  Worsening Chronicity:  Recurrent Relieved by:  OTC  medications (alka seltzer) Exacerbated by: exertion. Ineffective treatments:  None tried Associated symptoms: headaches (frontal, sinus pressure, 7/10, constant, no aggravating/alleviating factors) and sinus pain   Associated symptoms: no arthralgias, no myalgias, no neck pain, no sneezing, no swollen glands and no wheezing   Risk factors: chronic respiratory disease (asthma, does not have inhaler due to cost) and sick contacts     Past Medical History  Diagnosis Date  . Asthma    Past Surgical History  Procedure Laterality Date  . Anterior cruciate ligament repair     No family history on file. History  Substance Use Topics  . Smoking status: Former Games developer  . Smokeless tobacco: Never Used  . Alcohol Use: No   OB History   Grav Para Term Preterm Abortions TAB SAB Ect Mult Living                 Review of Systems  Constitutional: Positive for chills (now resolved) and diaphoresis (now resolved). Negative for fever and fatigue.  HENT: Positive for congestion, rhinorrhea and sinus pressure. Negative for ear discharge, ear pain, sneezing, sore throat, tinnitus and trouble swallowing.   Eyes: Negative for pain and discharge.  Respiratory: Positive for cough and shortness of breath. Negative for chest tightness and wheezing.   Cardiovascular: Negative for chest pain.  Gastrointestinal: Negative for nausea, vomiting, abdominal pain and diarrhea.  Musculoskeletal: Negative for arthralgias, myalgias and neck pain.  Skin: Negative for rash.  Allergic/Immunologic: Negative for environmental allergies.  Neurological: Positive for headaches (frontal, sinus pressure,  7/10, constant, no aggravating/alleviating factors). Negative for dizziness, weakness and light-headedness.    10 Systems reviewed and are negative for acute change except as noted in the HPI.   Allergies  Flagyl and Ibuprofen  Home Medications   Prior to Admission medications   Medication Sig Start Date End Date  Taking? Authorizing Provider  Phenyleph-CPM-DM-Aspirin (ALKA-SELTZER PLUS COLD & COUGH PO) Take by mouth.   Yes Historical Provider, MD  Fluticasone Propionate, Inhal, (FLOVENT IN) Inhale into the lungs.    Historical Provider, MD  HYDROcodone-acetaminophen (NORCO/VICODIN) 5-325 MG per tablet Take 1 tablet by mouth every 4 (four) hours as needed for moderate pain or severe pain. 05/24/14   Orlie Dakin, MD  ibuprofen (ADVIL,MOTRIN) 600 MG tablet Take 1 tablet (600 mg total) by mouth every 6 (six) hours as needed for pain. 05/09/13   Saddie Benders. Ghim, MD  metroNIDAZOLE (FLAGYL) 500 MG tablet Take 1 tablet (500 mg total) by mouth 2 (two) times daily. One po bid x 7 days 10/30/13   Karen Chafe Molpus, MD  Montelukast Sodium (SINGULAIR PO) Take by mouth.    Historical Provider, MD  pirbuterol (MAXAIR) 200 MCG/INH inhaler Inhale 2 puffs into the lungs 4 (four) times daily.    Historical Provider, MD  sodium chloride (OCEAN) 0.65 % SOLN nasal spray Place 1 spray into both nostrils as needed for congestion. 12/29/13   Merryl Hacker, MD   BP 112/77  Pulse 98  Temp(Src) 98.7 F (37.1 C) (Oral)  Resp 16  Ht $R'5\' 3"'cf$  (1.6 m)  Wt 115 lb (52.164 kg)  BMI 20.38 kg/m2  SpO2 100%  LMP 08/16/2014 Physical Exam  Nursing note and vitals reviewed. Constitutional: She is oriented to person, place, and time. Vital signs are normal. She appears well-developed and well-nourished.  Non-toxic appearance. No distress.  Afebrile, nontoxic, VSS, coughing intermittently  HENT:  Head: Normocephalic and atraumatic.  Nose: Mucosal edema and rhinorrhea present. Right sinus exhibits frontal sinus tenderness. Left sinus exhibits frontal sinus tenderness.  Mouth/Throat: Uvula is midline, oropharynx is clear and moist and mucous membranes are normal. No trismus in the jaw. No uvula swelling.  B/L nasal turbinate edema, erythema, and rhinorrhea. Ears clear bilaterally. Oropharynx clear. Frontal sinuses TTP bilaterally  Eyes:  Conjunctivae and EOM are normal. Right eye exhibits no discharge. Left eye exhibits no discharge.  Neck: Normal range of motion. Neck supple.  Cardiovascular: Normal rate, regular rhythm, normal heart sounds and intact distal pulses.  Exam reveals no gallop and no friction rub.   No murmur heard. Pulmonary/Chest: Effort normal. Not tachypneic. No respiratory distress. She has no decreased breath sounds. She has wheezes. She has rhonchi. She has no rales.  No increased WOB, oxygenation 100% on RA after neb treatment, no decreased breath sounds but diffuse scattered wheezing and rhonchi, does not clear with cough  Abdominal: Soft. Normal appearance and bowel sounds are normal. She exhibits no distension. There is no tenderness. There is no rigidity, no rebound and no guarding.  Musculoskeletal: Normal range of motion.  Neurological: She is alert and oriented to person, place, and time.  Skin: Skin is warm, dry and intact. No rash noted.  Psychiatric: She has a normal mood and affect.    ED Course  Procedures (including critical care time) Labs Review Labs Reviewed - No data to display  Imaging Review Dg Chest 2 View  08/28/2014   CLINICAL DATA:  Congested, wheezing  EXAM: CHEST  2 VIEW  COMPARISON:  12/29/2013  FINDINGS:  The heart size and mediastinal contours are within normal limits. Both lungs are clear. The visualized skeletal structures are unremarkable.  IMPRESSION: No active cardiopulmonary disease.   Electronically Signed   By: Kathreen Devoid   On: 08/28/2014 19:56     EKG Interpretation None      MDM   Final diagnoses:  Asthma, unspecified asthma severity, with acute exacerbation  Cough  URI (upper respiratory infection)  Nasal congestion    22y/o asthmatic female with URI symptoms, diffuse scattered wheezing after nebs, will give another neb and obtain CXR to r/o developing PNA. Decadron given while here. Will reassess. Plan to give albuterol inhaler here given that pt  cannot afford rx. Will have her f/up with pulmonology for ongoing management.  9:06 PM CXR neg for PNA, Lungs improved after 2nd nebs, will give puff here of albuterol and send inhaler home with her. Will ambulate pt and ensure oxygen maintained >90%. Discussed steam showers to help loosen phlegm, in addition to flonase/netipot use, and mucinex. Discussed good hydration. Will have her see pulmonology next week for ongoing management of her asthma, discussed the importance of this. I explained the diagnosis and have given explicit precautions to return to the ER including for any other new or worsening symptoms. The patient understands and accepts the medical plan as it's been dictated and I have answered their questions. Discharge instructions concerning home care and prescriptions have been given. The patient is STABLE and is discharged to home in good condition.  BP 112/77  Pulse 98  Temp(Src) 98.7 F (37.1 C) (Oral)  Resp 16  Ht $R'5\' 3"'bB$  (1.6 m)  Wt 115 lb (52.164 kg)  BMI 20.38 kg/m2  SpO2 100%  LMP 08/16/2014  Meds ordered this encounter  Medications  . albuterol (PROVENTIL) (2.5 MG/3ML) 0.083% nebulizer solution 5 mg    Sig:   . albuterol (PROVENTIL) (2.5 MG/3ML) 0.083% nebulizer solution 5 mg    Sig:   . ipratropium (ATROVENT) nebulizer solution 0.5 mg    Sig:   . dexamethasone (DECADRON) injection 10 mg    Sig:   . dexamethasone (DECADRON) 4 MG/ML injection    Sig:     Woodward Ku   : cabinet override  . albuterol (PROVENTIL HFA;VENTOLIN HFA) 108 (90 BASE) MCG/ACT inhaler 2 puff    Sig:   . aerochamber plus with mask device 1 each    Sig:   . fluticasone (FLONASE) 50 MCG/ACT nasal spray    Sig: Place 2 sprays into both nostrils daily.    Dispense:  16 g    Refill:  0    Order Specific Question:  Supervising Provider    Answer:  Noemi Chapel D [[id number]]  . Sodium Chloride-Sodium Bicarb (NETI POT SINUS WASH) 2300-700 MG KIT    Sig: Place 1 application into the nose 3  (three) times daily as needed (sinus congestion).    Dispense:  1 each    Refill:  2    Order Specific Question:  Supervising Provider    Answer:  Noemi Chapel D [[id number]]  . guaiFENesin (MUCINEX) 600 MG 12 hr tablet    Sig: Take 1 tablet (600 mg total) by mouth 2 (two) times daily as needed for cough or to loosen phlegm.    Dispense:  10 tablet    Refill:  0    Order Specific Question:  Supervising Provider    Answer:  Noemi Chapel D [[id number]]      Patty Sermons Camprubi-Soms, PA-C 08/28/14  2113 

## 2014-08-28 NOTE — ED Notes (Signed)
Cough, headache, congestion since friday

## 2014-08-28 NOTE — Discharge Instructions (Signed)
Continue to stay well-hydrated. Continue to alternate between Tylenol and Ibuprofen for pain or fever. Use inhaler as directed as needed for cough/shortness of breath/wheezing/chest congestion. Use mucinex for expectoration, and use flonase and/or netipot for nasal congestion. Sit in a steamed up the bathroom to help with chest congestion. Followup with a pulmonologist in 5-7 days for recheck of ongoing symptoms and ongoing asthma management. Return to emergency department for emergent changing or worsening of symptoms.   Asthma Asthma is a recurring condition in which the airways tighten and narrow. Asthma can make it difficult to breathe. It can cause coughing, wheezing, and shortness of breath. Asthma episodes, also called asthma attacks, range from minor to life-threatening. Asthma cannot be cured, but medicines and lifestyle changes can help control it. CAUSES Asthma is believed to be caused by inherited (genetic) and environmental factors, but its exact cause is unknown. Asthma may be triggered by allergens, lung infections, or irritants in the air. Asthma triggers are different for each person. Common triggers include:   Animal dander.  Dust mites.  Cockroaches.  Pollen from trees or grass.  Mold.  Smoke.  Air pollutants such as dust, household cleaners, hair sprays, aerosol sprays, paint fumes, strong chemicals, or strong odors.  Cold air, weather changes, and winds (which increase molds and pollens in the air).  Strong emotional expressions such as crying or laughing hard.  Stress.  Certain medicines (such as aspirin) or types of drugs (such as beta-blockers).  Sulfites in foods and drinks. Foods and drinks that may contain sulfites include dried fruit, potato chips, and sparkling grape juice.  Infections or inflammatory conditions such as the flu, a cold, or an inflammation of the nasal membranes (rhinitis).  Gastroesophageal reflux disease (GERD).  Exercise or strenuous  activity. SYMPTOMS Symptoms may occur immediately after asthma is triggered or many hours later. Symptoms include:  Wheezing.  Excessive nighttime or early morning coughing.  Frequent or severe coughing with a common cold.  Chest tightness.  Shortness of breath. DIAGNOSIS  The diagnosis of asthma is made by a review of your medical history and a physical exam. Tests may also be performed. These may include:  Lung function studies. These tests show how much air you breathe in and out.  Allergy tests.  Imaging tests such as X-rays. TREATMENT  Asthma cannot be cured, but it can usually be controlled. Treatment involves identifying and avoiding your asthma triggers. It also involves medicines. There are 2 classes of medicine used for asthma treatment:   Controller medicines. These prevent asthma symptoms from occurring. They are usually taken every day.  Reliever or rescue medicines. These quickly relieve asthma symptoms. They are used as needed and provide short-term relief. Your health care provider will help you create an asthma action plan. An asthma action plan is a written plan for managing and treating your asthma attacks. It includes a list of your asthma triggers and how they may be avoided. It also includes information on when medicines should be taken and when their dosage should be changed. An action plan may also involve the use of a device called a peak flow meter. A peak flow meter measures how well the lungs are working. It helps you monitor your condition. HOME CARE INSTRUCTIONS   Take medicines only as directed by your health care provider. Speak with your health care provider if you have questions about how or when to take the medicines.  Use a peak flow meter as directed by your health care  provider. Record and keep track of readings.  Understand and use the action plan to help minimize or stop an asthma attack without needing to seek medical care.  Control your  home environment in the following ways to help prevent asthma attacks:  Do not smoke. Avoid being exposed to secondhand smoke.  Change your heating and air conditioning filter regularly.  Limit your use of fireplaces and wood stoves.  Get rid of pests (such as roaches and mice) and their droppings.  Throw away plants if you see mold on them.  Clean your floors and dust regularly. Use unscented cleaning products.  Try to have someone else vacuum for you regularly. Stay out of rooms while they are being vacuumed and for a short while afterward. If you vacuum, use a dust mask from a hardware store, a double-layered or microfilter vacuum cleaner bag, or a vacuum cleaner with a HEPA filter.  Replace carpet with wood, tile, or vinyl flooring. Carpet can trap dander and dust.  Use allergy-proof pillows, mattress covers, and box spring covers.  Wash bed sheets and blankets every week in hot water and dry them in a dryer.  Use blankets that are made of polyester or cotton.  Clean bathrooms and kitchens with bleach. If possible, have someone repaint the walls in these rooms with mold-resistant paint. Keep out of the rooms that are being cleaned and painted.  Wash hands frequently. SEEK MEDICAL CARE IF:   You have wheezing, shortness of breath, or a cough even if taking medicine to prevent attacks.  The colored mucus you cough up (sputum) is thicker than usual.  Your sputum changes from clear or white to yellow, green, gray, or bloody.  You have any problems that may be related to the medicines you are taking (such as a rash, itching, swelling, or trouble breathing).  You are using a reliever medicine more than 2-3 times per week.  Your peak flow is still at 50-79% of your personal best after following your action plan for 1 hour.  You have a fever. SEEK IMMEDIATE MEDICAL CARE IF:   You seem to be getting worse and are unresponsive to treatment during an asthma attack.  You are  short of breath even at rest.  You get short of breath when doing very little physical activity.  You have difficulty eating, drinking, or talking due to asthma symptoms.  You develop chest pain.  You develop a fast heartbeat.  You have a bluish color to your lips or fingernails.  You are light-headed, dizzy, or faint.  Your peak flow is less than 50% of your personal best. MAKE SURE YOU:   Understand these instructions.  Will watch your condition.  Will get help right away if you are not doing well or get worse. Document Released: 11/23/2005 Document Revised: 04/09/2014 Document Reviewed: 06/22/2013 [facility] Patient Information 2015 [place], [place]. This information is not intended to replace advice given to you by your health care provider. Make sure you discuss any questions you have with your health care provider.  Asthma Attack Prevention Although there is no way to prevent asthma from starting, you can take steps to control the disease and reduce its symptoms. Learn about your asthma and how to control it. Take an active role to control your asthma by working with your health care provider to create and follow an asthma action plan. An asthma action plan guides you in:  Taking your medicines properly.  Avoiding things that set off your asthma  or make your asthma worse (asthma triggers).  Tracking your level of asthma control.  Responding to worsening asthma.  Seeking emergency care when needed. To track your asthma, keep records of your symptoms, check your peak flow number using a handheld device that shows how well air moves out of your lungs (peak flow meter), and get regular asthma checkups.  WHAT ARE SOME WAYS TO PREVENT AN ASTHMA ATTACK?  Take medicines as directed by your health care provider.  Keep track of your asthma symptoms and level of control.  With your health care provider, write a detailed plan for taking medicines and managing an asthma attack. Then  be sure to follow your action plan. Asthma is an ongoing condition that needs regular monitoring and treatment.  Identify and avoid asthma triggers. Many outdoor allergens and irritants (such as pollen, mold, cold air, and air pollution) can trigger asthma attacks. Find out what your asthma triggers are and take steps to avoid them.  Monitor your breathing. Learn to recognize warning signs of an attack, such as coughing, wheezing, or shortness of breath. Your lung function may decrease before you notice any signs or symptoms, so regularly measure and record your peak airflow with a home peak flow meter.  Identify and treat attacks early. If you act quickly, you are less likely to have a severe attack. You will also need less medicine to control your symptoms. When your peak flow measurements decrease and alert you to an upcoming attack, take your medicine as instructed and immediately stop any activity that may have triggered the attack. If your symptoms do not improve, get medical help.  Pay attention to increasing quick-relief inhaler use. If you find yourself relying on your quick-relief inhaler, your asthma is not under control. See your health care provider about adjusting your treatment. WHAT CAN MAKE MY SYMPTOMS WORSE? A number of common things can set off or make your asthma symptoms worse and cause temporary increased inflammation of your airways. Keep track of your asthma symptoms for several weeks, detailing all the environmental and emotional factors that are linked with your asthma. When you have an asthma attack, go back to your asthma diary to see which factor, or combination of factors, might have contributed to it. Once you know what these factors are, you can take steps to control many of them. If you have allergies and asthma, it is important to take asthma prevention steps at home. Minimizing contact with the substance to which you are allergic will help prevent an asthma attack. Some  triggers and ways to avoid these triggers are: Animal Dander:  Some people are allergic to the flakes of skin or dried saliva from animals with fur or feathers.   There is no such thing as a hypoallergenic dog or cat breed. All dogs or cats can cause allergies, even if they don't shed.  Keep these pets out of your home.  If you are not able to keep a pet outdoors, keep the pet out of your bedroom and other sleeping areas at all times, and keep the door closed.  Remove carpets and furniture covered with cloth from your home. If that is not possible, keep the pet away from fabric-covered furniture and carpets. Dust Mites: Many people with asthma are allergic to dust mites. Dust mites are tiny bugs that are found in every home in mattresses, pillows, carpets, fabric-covered furniture, bedcovers, clothes, stuffed toys, and other fabric-covered items.   Cover your mattress in a special dust-proof  cover.  Cover your pillow in a special dust-proof cover, or wash the pillow each week in hot water. Water must be hotter than 130 F (54.4 C) to kill dust mites. Cold or warm water used with detergent and bleach can also be effective.  Wash the sheets and blankets on your bed each week in hot water.  Try not to sleep or lie on cloth-covered cushions.  Call ahead when traveling and ask for a smoke-free hotel room. Bring your own bedding and pillows in case the hotel only supplies feather pillows and down comforters, which may contain dust mites and cause asthma symptoms.  Remove carpets from your bedroom and those laid on concrete, if you can.  Keep stuffed toys out of the bed, or wash the toys weekly in hot water or cooler water with detergent and bleach. Cockroaches: Many people with asthma are allergic to the droppings and remains of cockroaches.   Keep food and garbage in closed containers. Never leave food out.  Use poison baits, traps, powders, gels, or paste (for example, boric acid).  If  a spray is used to kill cockroaches, stay out of the room until the odor goes away. Indoor Mold:  Fix leaky faucets, pipes, or other sources of water that have mold around them.  Clean floors and moldy surfaces with a fungicide or diluted bleach.  Avoid using humidifiers, vaporizers, or swamp coolers. These can spread molds through the air. Pollen and Outdoor Mold:  When pollen or mold spore counts are high, try to keep your windows closed.  Stay indoors with windows closed from late morning to afternoon. Pollen and some mold spore counts are highest at that time.  Ask your health care provider whether you need to take anti-inflammatory medicine or increase your dose of the medicine before your allergy season starts. Other Irritants to Avoid:  Tobacco smoke is an irritant. If you smoke, ask your health care provider how you can quit. Ask family members to quit smoking, too. Do not allow smoking in your home or car.  If possible, do not use a wood-burning stove, kerosene heater, or fireplace. Minimize exposure to all sources of smoke, including incense, candles, fires, and fireworks.  Try to stay away from strong odors and sprays, such as perfume, talcum powder, hair spray, and paints.  Decrease humidity in your home and use an indoor air cleaning device. Reduce indoor humidity to below 60%. Dehumidifiers or central air conditioners can do this.  Decrease house dust exposure by changing furnace and air cooler filters frequently.  Try to have someone else vacuum for you once or twice a week. Stay out of rooms while they are being vacuumed and for a short while afterward.  If you vacuum, use a dust mask from a hardware store, a double-layered or microfilter vacuum cleaner bag, or a vacuum cleaner with a HEPA filter.  Sulfites in foods and beverages can be irritants. Do not drink beer or wine or eat dried fruit, processed potatoes, or shrimp if they cause asthma symptoms.  Cold air can  trigger an asthma attack. Cover your nose and mouth with a scarf on cold or windy days.  Several health conditions can make asthma more difficult to manage, including a runny nose, sinus infections, reflux disease, psychological stress, and sleep apnea. Work with your health care provider to manage these conditions.  Avoid close contact with people who have a respiratory infection such as a cold or the flu, since your asthma symptoms may  get worse if you catch the infection. Wash your hands thoroughly after touching items that may have been handled by people with a respiratory infection.  Get a flu shot every year to protect against the flu virus, which often makes asthma worse for days or weeks. Also get a pneumonia shot if you have not previously had one. Unlike the flu shot, the pneumonia shot does not need to be given yearly. Medicines:  Talk to your health care provider about whether it is safe for you to take aspirin or non-steroidal anti-inflammatory medicines (NSAIDs). In a small number of people with asthma, aspirin and NSAIDs can cause asthma attacks. These medicines must be avoided by people who have known aspirin-sensitive asthma. It is important that people with aspirin-sensitive asthma read labels of all over-the-counter medicines used to treat pain, colds, coughs, and fever.  Beta-blockers and ACE inhibitors are other medicines you should discuss with your health care provider. HOW CAN I FIND OUT WHAT I AM ALLERGIC TO? Ask your asthma health care provider about allergy skin testing or blood testing (the RAST test) to identify the allergens to which you are sensitive. If you are found to have allergies, the most important thing to do is to try to avoid exposure to any allergens that you are sensitive to as much as possible. Other treatments for allergies, such as medicines and allergy shots (immunotherapy) are available.  CAN I EXERCISE? Follow your health care provider's advice  regarding asthma treatment before exercising. It is important to maintain a regular exercise program, but vigorous exercise or exercise in cold, humid, or dry environments can cause asthma attacks, especially for those people who have exercise-induced asthma. Document Released: 11/11/2009 Document Revised: 11/28/2013 Document Reviewed: 05/31/2013 [facility] Patient Information 2015 [place], [place]. This information is not intended to replace advice given to you by your health care provider. Make sure you discuss any questions you have with your health care provider.  Upper Respiratory Infection, Adult An upper respiratory infection (URI) is also known as the common cold. It is often caused by a type of germ (virus). Colds are easily spread (contagious). You can pass it to others by kissing, coughing, sneezing, or drinking out of the same glass. Usually, you get better in 1 or 2 weeks.  HOME CARE   Only take medicine as told by your doctor.  Use a warm mist humidifier or breathe in steam from a hot shower.  Drink enough water and fluids to keep your pee (urine) clear or pale yellow.  Get plenty of rest.  Return to work when your temperature is back to normal or as told by your doctor. You may use a face mask and wash your hands to stop your cold from spreading. GET HELP RIGHT AWAY IF:   After the first few days, you feel you are getting worse.  You have questions about your medicine.  You have chills, shortness of breath, or brown or red spit (mucus).  You have yellow or brown snot (nasal discharge) or pain in the face, especially when you bend forward.  You have a fever, puffy (swollen) neck, pain when you swallow, or white spots in the back of your throat.  You have a bad headache, ear pain, sinus pain, or chest pain.  You have a high-pitched whistling sound when you breathe in and out (wheezing).  You have a lasting cough or cough up blood.  You have sore muscles or a stiff  neck. MAKE SURE YOU:  Understand these instructions.  Will watch your condition.  Will get help right away if you are not doing well or get worse. Document Released: 05/11/2008 Document Revised: 02/15/2012 Document Reviewed: 02/28/2014 [facility] Patient Information 2015 [place], [place]. This information is not intended to replace advice given to you by your health care provider. Make sure you discuss any questions you have with your health care provider.  How to Use an Inhaler Using your inhaler correctly is very important. Good technique will make sure that the medicine reaches your lungs.  HOW TO USE AN INHALER: 1. Take the cap off the inhaler. 2. If this is the first time using your inhaler, you need to prime it. Shake the inhaler for 5 seconds. Release four puffs into the air, away from your face. Ask your doctor for help if you have questions. 3. Shake the inhaler for 5 seconds. 4. Turn the inhaler so the bottle is above the mouthpiece. 5. Put your pointer finger on top of the bottle. Your thumb holds the bottom of the inhaler. 6. Open your mouth. 7. Either hold the inhaler away from your mouth (the width of 2 fingers) or place your lips tightly around the mouthpiece. Ask your doctor which way to use your inhaler. 8. Breathe out as much air as possible. 9. Breathe in and push down on the bottle 1 time to release the medicine. You will feel the medicine go in your mouth and throat. 10. Continue to take a deep breath in very slowly. Try to fill your lungs. 11. After you have breathed in completely, hold your breath for 10 seconds. This will help the medicine to settle in your lungs. If you cannot hold your breath for 10 seconds, hold it for as long as you can before you breathe out. 12. Breathe out slowly, through pursed lips. Whistling is an example of pursed lips. 13. If your doctor has told you to take more than 1 puff, wait at least 15-30 seconds between puffs. This will help you get  the best results from your medicine. Do not use the inhaler more than your doctor tells you to. 14. Put the cap back on the inhaler. 15. Follow the directions from your doctor or from the inhaler package about cleaning the inhaler. If you use more than one inhaler, ask your doctor which inhalers to use and what order to use them in. Ask your doctor to help you figure out when you will need to refill your inhaler.  If you use a steroid inhaler, always rinse your mouth with water after your last puff, gargle and spit out the water. Do not swallow the water. GET HELP IF:  The inhaler medicine only partially helps to stop wheezing or shortness of breath.  You are having trouble using your inhaler.  You have some increase in thick spit (phlegm). GET HELP RIGHT AWAY IF:  The inhaler medicine does not help your wheezing or shortness of breath or you have tightness in your chest.  You have dizziness, headaches, or fast heart rate.  You have chills, fever, or night sweats.  You have a large increase of thick spit, or your thick spit is bloody. MAKE SURE YOU:   Understand these instructions.  Will watch your condition.  Will get help right away if you are not doing well or get worse. Document Released: 09/01/2008 Document Revised: 09/13/2013 Document Reviewed: 06/22/2013 [facility] Patient Information 2015 [place], [place]. This information is not intended to replace advice given to you  by your health care provider. Make sure you discuss any questions you have with your health care provider.  Emergency Department Resource Guide 1) Find a Doctor and Pay Out of Pocket Although you won't have to find out who is covered by your insurance plan, it is a good idea to ask around and get recommendations. You will then need to call the office and see if the doctor you have chosen will accept you as a new patient and what types of options they offer for patients who are self-pay. Some doctors offer  discounts or will set up payment plans for their patients who do not have insurance, but you will need to ask so you aren't surprised when you get to your appointment.  2) Contact Your Local Health Department Not all health departments have doctors that can see patients for sick visits, but many do, so it is worth a call to see if yours does. If you don't know where your local health department is, you can check in your phone book. The CDC also has a tool to help you locate your state's health department, and many state websites also have listings of all of their local health departments.  3) Find a Walk-in Clinic If your illness is not likely to be very severe or complicated, you may want to try a walk in clinic. These are popping up all over the country in pharmacies, drugstores, and shopping centers. They're usually staffed by nurse practitioners or physician assistants that have been trained to treat common illnesses and complaints. They're usually fairly quick and inexpensive. However, if you have serious medical issues or chronic medical problems, these are probably not your best option.  No Primary Care Doctor: - Call [facility] at  [phone] - they can help you locate a primary care doctor that  accepts your insurance, provides certain services, etc. - Physician Referral Service- [phone]  Chronic Pain Problems: Organization         Address  Phone   Notes  [facility]  [phone] Patients need to be referred by their primary care doctor.   Medication Assistance: Organization         Address  Phone   Notes  [facility] Medication [facility] [address] [place] [place], [place] [address] [phone] --Must be a resident of [facility] -- Must have NO insurance coverage whatsoever (no Medicaid/ Medicare, etc.) -- The pt. MUST have a primary care doctor that directs their care regularly and follows them in the community   [facility]   [phone]   [employer]  [phone]    Agencies that provide inexpensive medical care: Organization         Address  Phone   Notes  [facility]  [phone]   [facility]    [phone]   [facility] [address] [place], [place] [address] [phone]   [facility] [address] [place], [place] [phone]   [facility]    [phone]   [facility]    [phone]   [facility]  [address], [place] Phone:  [phone], Fax:  [phone] Hours of Operation:  9 am - 6 pm, M-F.  Also accepts Medicaid/Medicare and self-pay.  [facility]  [address], [place] [place] Phone: [phone], Fax: [phone]. Hours  of Operation:  8:30 am - 5:30 pm, M-F.  Also accepts Medicaid and self-pay.  [facility] [place] [address], [place] Phone: [phone]   [facility] [address] Natasha Bence [place], [place] [phone], Ext. [phone] Mondays & Thursdays: 7-9 AM.  First 15 patients are seen on a first come, first serve basis.    Medicaid-accepting [facility] Providers:  Organization         Address  Phone   Notes  [facility] [address], [place] [phone] Also accepts self-pay patients.  [facility] [address] Laurell Josephs [place], [place]  [phone]   [facility] [address], [address], [place] [phone]   [facility] [address], [place] [phone]   Renaye Rakers [address], [place]   [phone] Only accepts [place] patients after they have their name applied to their card.   Self-Pay (no insurance) in [facility]:  Organization         Address  Phone   Notes  Sickle Cell Patients, [facility] [address] [place], [place] [phone]   [facility] [address] [place], [place] [phone]   [facility] [place]  [address], [place] [phone]   [facility]/Dr. Osei-Bonsu  [address] [place] or [address], [place] [phone] Phone number for both [place] and [place] locations is the same.  [facility] and [facility] [address], [place] [phone]   [facility] [address], [place] or [address] [phone]   [facility] [address] [place] [phone], phone; [phone], fax Sees patients 1st and 3rd Saturday of every month.  Must not qualify for public or private insurance (i.e. Medicaid, Medicare, [place] Health Choice, Veterans' Benefits)  Household income should be no more than 200% of the poverty level The clinic cannot treat you if you are pregnant or think you are pregnant  Sexually transmitted diseases are not treated at the clinic.

## 2014-09-01 NOTE — ED Provider Notes (Signed)
Medical screening examination/treatment/procedure(s) were performed by non-physician practitioner and as supervising physician I was immediately available for consultation/collaboration.   EKG Interpretation None        Mirian Mo, MD 09/01/14 0700

## 2014-10-28 ENCOUNTER — Emergency Department (HOSPITAL_BASED_OUTPATIENT_CLINIC_OR_DEPARTMENT_OTHER)
Admission: EM | Admit: 2014-10-28 | Discharge: 2014-10-28 | Disposition: A | Discharge status: Home / Self Care | Payer: No Typology Code available for payment source | Attending: Emergency Medicine | Admitting: Emergency Medicine

## 2014-10-28 ENCOUNTER — Encounter (HOSPITAL_BASED_OUTPATIENT_CLINIC_OR_DEPARTMENT_OTHER): Payer: Self-pay | Admitting: *Deleted

## 2014-10-28 DIAGNOSIS — Y92410 Unspecified street and highway as the place of occurrence of the external cause: Secondary | ICD-10-CM | POA: Diagnosis not present

## 2014-10-28 DIAGNOSIS — Z7982 Long term (current) use of aspirin: Secondary | ICD-10-CM | POA: Diagnosis not present

## 2014-10-28 DIAGNOSIS — Z79899 Other long term (current) drug therapy: Secondary | ICD-10-CM | POA: Insufficient documentation

## 2014-10-28 DIAGNOSIS — Z87891 Personal history of nicotine dependence: Secondary | ICD-10-CM | POA: Diagnosis not present

## 2014-10-28 DIAGNOSIS — Y998 Other external cause status: Secondary | ICD-10-CM | POA: Diagnosis not present

## 2014-10-28 DIAGNOSIS — Y9389 Activity, other specified: Secondary | ICD-10-CM | POA: Diagnosis not present

## 2014-10-28 DIAGNOSIS — Z791 Long term (current) use of non-steroidal anti-inflammatories (NSAID): Secondary | ICD-10-CM | POA: Insufficient documentation

## 2014-10-28 DIAGNOSIS — S3992XA Unspecified injury of lower back, initial encounter: Secondary | ICD-10-CM | POA: Insufficient documentation

## 2014-10-28 DIAGNOSIS — Z7951 Long term (current) use of inhaled steroids: Secondary | ICD-10-CM | POA: Insufficient documentation

## 2014-10-28 DIAGNOSIS — J45909 Unspecified asthma, uncomplicated: Secondary | ICD-10-CM | POA: Insufficient documentation

## 2014-10-28 MED ORDER — TRAMADOL HCL 50 MG PO TABS: 50.0000 mg | ORAL_TABLET | Freq: Four times a day (QID) | ORAL | Status: DC | PRN

## 2014-10-28 MED ORDER — IBUPROFEN 600 MG PO TABS: 600.0000 mg | ORAL_TABLET | Freq: Four times a day (QID) | ORAL | Status: DC | PRN

## 2014-10-28 MED ORDER — METHOCARBAMOL 500 MG PO TABS: 500.0000 mg | ORAL_TABLET | Freq: Two times a day (BID) | ORAL | Status: DC
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
## 2014-10-28 NOTE — Discharge Instructions (Signed)
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
## 2014-11-25 ENCOUNTER — Encounter (HOSPITAL_BASED_OUTPATIENT_CLINIC_OR_DEPARTMENT_OTHER): Payer: Self-pay | Admitting: *Deleted

## 2014-11-25 ENCOUNTER — Emergency Department (HOSPITAL_BASED_OUTPATIENT_CLINIC_OR_DEPARTMENT_OTHER)
Admission: EM | Admit: 2014-11-25 | Discharge: 2014-11-25 | Disposition: A | Discharge status: Home / Self Care | Payer: No Typology Code available for payment source | Attending: Emergency Medicine | Admitting: Emergency Medicine

## 2014-11-25 DIAGNOSIS — Z7951 Long term (current) use of inhaled steroids: Secondary | ICD-10-CM | POA: Insufficient documentation

## 2014-11-25 DIAGNOSIS — L0231 Cutaneous abscess of buttock: Secondary | ICD-10-CM

## 2014-11-25 DIAGNOSIS — Z79899 Other long term (current) drug therapy: Secondary | ICD-10-CM | POA: Insufficient documentation

## 2014-11-25 DIAGNOSIS — Z23 Encounter for immunization: Secondary | ICD-10-CM | POA: Insufficient documentation

## 2014-11-25 DIAGNOSIS — J45909 Unspecified asthma, uncomplicated: Secondary | ICD-10-CM | POA: Insufficient documentation

## 2014-11-25 MED ORDER — TETANUS-DIPHTH-ACELL PERTUSSIS 5-2.5-18.5 LF-MCG/0.5 IM SUSP
0.5000 mL | Freq: Once | INTRAMUSCULAR | Status: AC
  Administered 2014-11-25: 0.5 mL via INTRAMUSCULAR
  Filled 2014-11-25: qty 0.5

## 2014-11-25 MED ORDER — LIDOCAINE-EPINEPHRINE 2 %-1:100000 IJ SOLN
20.0000 mL | Freq: Once | INTRAMUSCULAR | Status: AC
  Administered 2014-11-25: 20 mL via INTRADERMAL
  Filled 2014-11-25: qty 1

## 2014-11-25 MED ORDER — HYDROCODONE-ACETAMINOPHEN 5-325 MG PO TABS: 1.0000 | ORAL_TABLET | Freq: Four times a day (QID) | ORAL | Status: DC | PRN

## 2014-11-25 NOTE — ED Notes (Signed)
PA at bedside.

## 2014-11-25 NOTE — ED Provider Notes (Signed)
CSN: [id number]     Arrival date & time 11/25/14  1943 History   First MD Initiated Contact with Patient 11/25/14 2009     Chief Complaint  Patient presents with  . Abscess     (Consider location/radiation/quality/duration/timing/severity/associated sxs/prior Treatment) HPI   23 year old female presents for evaluation of an abscess to her right buttock. Patient noticed an area of tenderness with a knot-like sensation to her right buttocks which has been ongoing for the past 2-3 days. Pain has increased worsening with palpation and with movement. She has never had this in the past. She denies any recent trauma. She has tried using warm compress with minimal improvement. She cannot recall her last tetanus shot. No associated fever, or rash. Denies any pain with bowel movement.  Past Medical History  Diagnosis Date  . Asthma    Past Surgical History  Procedure Laterality Date  . Anterior cruciate ligament repair     History reviewed. No pertinent family history. History  Substance Use Topics  . Smoking status: Former Games developermoker  . Smokeless tobacco: Never Used  . Alcohol Use: No   OB History    No data available     Review of Systems  Constitutional: Negative for fever.  Genitourinary: Negative for dysuria.  Skin: Negative for rash.  Neurological: Negative for numbness.      Allergies  Flagyl and Ibuprofen  Home Medications   Prior to Admission medications   Medication Sig Start Date End Date Taking? Authorizing Provider  fluticasone (FLONASE) 50 MCG/ACT nasal spray Place 2 sprays into both nostrils daily. 08/28/14   Mercedes Strupp Camprubi-Soms, PA-C  Fluticasone Propionate, Inhal, (FLOVENT IN) Inhale into the lungs.    Historical Provider, MD  methocarbamol (ROBAXIN) 500 MG tablet Take 1 tablet (500 mg total) by mouth 2 (two) times daily. 10/28/14   Fayrene HelperBowie Jillienne Egner, PA-C  methocarbamol (ROBAXIN) 500 MG tablet Take 1 tablet (500 mg total) by mouth 2 (two) times daily.  10/28/14   Fayrene HelperBowie Yvette Loveless, PA-C  Montelukast Sodium (SINGULAIR PO) Take by mouth.    Historical Provider, MD  Phenyleph-CPM-DM-Aspirin (ALKA-SELTZER PLUS COLD & COUGH PO) Take by mouth.    Historical Provider, MD  pirbuterol (MAXAIR) 200 MCG/INH inhaler Inhale 2 puffs into the lungs 4 (four) times daily.    Historical Provider, MD   BP 117/69 mmHg  Pulse 103  Temp(Src) 98.5 F (36.9 C) (Oral)  Resp 18  Ht 5\' 2"  (1.575 m)  Wt 115 lb (52.164 kg)  BMI 21.03 kg/m2  SpO2 100%  LMP 11/11/2014 Physical Exam  Constitutional: She appears well-developed and well-nourished. No distress.  HENT:  Head: Atraumatic.  Eyes: Conjunctivae are normal.  Neck: Neck supple.  Genitourinary:  Chaperone present: An area of induration and fluctuance noted to medial right gluteal region inferior to the vagina without rectal involvement. Tenderness to palpation.  Neurological: She is alert.  Skin: No rash noted.  Psychiatric: She has a normal mood and affect.  Nursing note and vitals reviewed.   ED Course  Procedures (including critical care time)  Patient presents with an abscess to her right gluteal region not involving rectum or vagina. Will update her tetanus and will perform I and D.  INCISION AND DRAINAGE Performed by: Fayrene HelperRAN,Shakena Callari Consent: Verbal consent obtained. Risks and benefits: risks, benefits and alternatives were discussed Type: abscess  Body area: R buttock  Anesthesia: local infiltration  Incision was made with a scalpel.  Local anesthetic: lidocaine 2% w epinephrine  Anesthetic total: 3  ml  Complexity: complex Blunt dissection to break up loculations  Drainage: purulent  Drainage amount: moderate  Packing material: 1/4 in iodoform gauze  Patient tolerance: Patient tolerated the procedure well with no immediate complications.    Labs Review Labs Reviewed - No data to display  Imaging Review No results found.   EKG Interpretation None      MDM   Final  diagnoses:  Abscess of right buttock    BP 117/69 mmHg  Pulse 103  Temp(Src) 98.5 F (36.9 C) (Oral)  Resp 18  Ht 5\' 2"  (1.575 m)  Wt 115 lb (52.164 kg)  BMI 21.03 kg/m2  SpO2 100%  LMP 11/11/2014     Fayrene HelperBowie Oriel Rumbold, PA-C 11/25/14 2109  Rolan BuccoMelanie Belfi, MD 11/25/14 2338

## 2014-11-25 NOTE — Discharge Instructions (Signed)
Please continue to apply warm compress to your abscess daily to help improve drainage. In 2 days you can remove the packing. If you noticed worsening pain please follow-up at urgent care for reevaluation. Take pain medication as needed.  Abscess Care After An abscess (also called a boil or furuncle) is an infected area that contains a collection of pus. Signs and symptoms of an abscess include pain, tenderness, redness, or hardness, or you may feel a moveable soft area under your skin. An abscess can occur anywhere in the body. The infection may spread to surrounding tissues causing cellulitis. A cut (incision) by the surgeon was made over your abscess and the pus was drained out. Gauze may have been packed into the space to provide a drain that will allow the cavity to heal from the inside outwards. The boil may be painful for 5 to 7 days. Most people with a boil do not have high fevers. Your abscess, if seen early, may not have localized, and may not have been lanced. If not, another appointment may be required for this if it does not get better on its own or with medications. HOME CARE INSTRUCTIONS   Only take over-the-counter or prescription medicines for pain, discomfort, or fever as directed by your caregiver.  When you bathe, soak and then remove gauze or iodoform packs at least daily or as directed by your caregiver. You may then wash the wound gently with mild soapy water. Repack with gauze or do as your caregiver directs. SEEK IMMEDIATE MEDICAL CARE IF:   You develop increased pain, swelling, redness, drainage, or bleeding in the wound site.  You develop signs of generalized infection including muscle aches, chills, fever, or a general ill feeling.  An oral temperature above 102 F (38.9 C) develops, not controlled by medication. See your caregiver for a recheck if you develop any of the symptoms described above. If medications (antibiotics) were prescribed, take them as  directed. Document Released: 06/11/2005 Document Revised: 02/15/2012 Document Reviewed: 02/06/2008 [facility] Patient Information 2015 [place]. This information is not intended to replace advice given to you by your health care provider. Make sure you discuss any questions you have with your health care provider.

## 2014-11-25 NOTE — ED Notes (Signed)
Pt c/o abscess to right buttocks x 2 days 

## 2014-11-29 ENCOUNTER — Emergency Department (HOSPITAL_BASED_OUTPATIENT_CLINIC_OR_DEPARTMENT_OTHER)
Admission: EM | Admit: 2014-11-29 | Discharge: 2014-11-29 | Disposition: A | Discharge status: Home / Self Care | Payer: No Typology Code available for payment source | Attending: Emergency Medicine | Admitting: Emergency Medicine

## 2014-11-29 ENCOUNTER — Encounter (HOSPITAL_BASED_OUTPATIENT_CLINIC_OR_DEPARTMENT_OTHER): Payer: Self-pay | Admitting: *Deleted

## 2014-11-29 DIAGNOSIS — Z09 Encounter for follow-up examination after completed treatment for conditions other than malignant neoplasm: Secondary | ICD-10-CM

## 2014-11-29 DIAGNOSIS — J45909 Unspecified asthma, uncomplicated: Secondary | ICD-10-CM | POA: Insufficient documentation

## 2014-11-29 DIAGNOSIS — Z7952 Long term (current) use of systemic steroids: Secondary | ICD-10-CM | POA: Insufficient documentation

## 2014-11-29 DIAGNOSIS — Z4801 Encounter for change or removal of surgical wound dressing: Secondary | ICD-10-CM | POA: Insufficient documentation

## 2014-11-29 DIAGNOSIS — Z79899 Other long term (current) drug therapy: Secondary | ICD-10-CM | POA: Insufficient documentation

## 2014-11-29 DIAGNOSIS — Z87891 Personal history of nicotine dependence: Secondary | ICD-10-CM | POA: Insufficient documentation

## 2014-11-29 NOTE — ED Notes (Signed)
Pt amb to room 1 with quick steady gait in nad. Pt requests recheck of abcess to right inner thigh I&D'd here on Sunday. Pt reports "its so much better today!"

## 2014-11-29 NOTE — ED Provider Notes (Signed)
CSN: [id number]     Arrival date & time 11/29/14  [id number] History   First MD Initiated Contact with Patient 11/29/14 [phone]     Chief Complaint  Patient presents with  . Wound Check     (Consider location/radiation/quality/duration/timing/severity/associated sxs/prior Treatment) Patient is a 23 y.o. female presenting with wound check. The history is provided by the patient.  Wound Check This is a new problem. Episode onset: 5 days ago had I&D of abscess. The problem occurs constantly. The problem has been gradually improving. Associated symptoms comments: Area is better.  Less pain and hardness.  Still some draining from packing. Exacerbated by: pressure. Nothing relieves the symptoms.    Past Medical History  Diagnosis Date  . Asthma    Past Surgical History  Procedure Laterality Date  . Anterior cruciate ligament repair     History reviewed. No pertinent family history. History  Substance Use Topics  . Smoking status: Former Games developermoker  . Smokeless tobacco: Never Used  . Alcohol Use: No   OB History    No data available     Review of Systems  All other systems reviewed and are negative.     Allergies  Flagyl and Ibuprofen  Home Medications   Prior to Admission medications   Medication Sig Start Date End Date Taking? Authorizing Provider  fluticasone (FLONASE) 50 MCG/ACT nasal spray Place 2 sprays into both nostrils daily. 08/28/14   Mercedes Strupp Camprubi-Soms, PA-C  Fluticasone Propionate, Inhal, (FLOVENT IN) Inhale into the lungs.    Historical Provider, MD  HYDROcodone-acetaminophen (NORCO/VICODIN) 5-325 MG per tablet Take 1 tablet by mouth every 6 (six) hours as needed for moderate pain or severe pain. 11/25/14   Fayrene HelperBowie Tran, PA-C  methocarbamol (ROBAXIN) 500 MG tablet Take 1 tablet (500 mg total) by mouth 2 (two) times daily. 10/28/14   Fayrene HelperBowie Tran, PA-C  methocarbamol (ROBAXIN) 500 MG tablet Take 1 tablet (500 mg total) by mouth 2 (two) times daily. 10/28/14   Fayrene HelperBowie  Tran, PA-C  Montelukast Sodium (SINGULAIR PO) Take by mouth.    Historical Provider, MD  Phenyleph-CPM-DM-Aspirin (ALKA-SELTZER PLUS COLD & COUGH PO) Take by mouth.    Historical Provider, MD  pirbuterol (MAXAIR) 200 MCG/INH inhaler Inhale 2 puffs into the lungs 4 (four) times daily.    Historical Provider, MD   Pulse 82  Temp(Src) 98 F (36.7 C) (Oral)  Resp 18  SpO2 100%  LMP 11/11/2014 Physical Exam  Constitutional: She is oriented to person, place, and time. She appears well-developed and well-nourished. No distress.  HENT:  Head: Normocephalic and atraumatic.  Cardiovascular: Normal rate.   Pulmonary/Chest: Effort normal.  Genitourinary:     Neurological: She is alert and oriented to person, place, and time.  Psychiatric: She has a normal mood and affect. Her behavior is normal.    ED Course  Procedures (including critical care time) Labs Review Labs Reviewed - No data to display  Imaging Review No results found.   EKG Interpretation None      MDM   Final diagnoses:  Encounter for recheck of abscess following incision and drainage    Patient is here for abscess recheck. Area is healing appropriately without any surrounding erythema. Packing removed at this time induration is only minimal. Patient states she's feeling much better pain has stressed clear improved. Patient will continue warm soaks and localized wound care.    Gwyneth SproutWhitney Ka Flammer, MD 11/29/14 1004

## 2015-01-22 ENCOUNTER — Ambulatory Visit: Payer: No Typology Code available for payment source | Admitting: Physical Therapy

## 2015-02-06 ENCOUNTER — Encounter: Payer: Self-pay | Admitting: Physical Therapy

## 2015-02-06 ENCOUNTER — Ambulatory Visit: Payer: No Typology Code available for payment source | Attending: Chiropractic Medicine | Admitting: Physical Therapy

## 2015-02-06 DIAGNOSIS — M545 Low back pain, unspecified: Secondary | ICD-10-CM

## 2015-02-06 DIAGNOSIS — M546 Pain in thoracic spine: Secondary | ICD-10-CM | POA: Insufficient documentation
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
## 2015-02-13 ENCOUNTER — Ambulatory Visit: Payer: No Typology Code available for payment source | Admitting: Physical Therapy

## 2015-02-13 ENCOUNTER — Encounter: Payer: Self-pay | Admitting: Physical Therapy

## 2015-02-13 DIAGNOSIS — M545 Low back pain, unspecified: Secondary | ICD-10-CM

## 2015-02-13 DIAGNOSIS — M546 Pain in thoracic spine: Secondary | ICD-10-CM

## 2015-02-13 NOTE — Therapy (Signed)
[facility] [address] [place], [place], [address] Phone: [phone]   Fax:  [phone]  Physical Therapy Treatment  Patient Details  Name: Kimberly PavlovSiera L Kujawa MRN: [id number] Date of Birth: 02-14-1991 Referring Provider:  Pete GlatterFricke, Jeffrey, DC  Encounter Date: 02/13/2015      PT End of Session - 02/13/15 1142    Visit Number 2   Number of Visits 16   PT Start Time 1055   PT Stop Time 1158   PT Time Calculation (min) 63 min   Activity Tolerance Patient tolerated treatment well      Past Medical History  Diagnosis Date  . Asthma     Past Surgical History  Procedure Laterality Date  . Anterior cruciate ligament repair      There were no vitals taken for this visit.  Visit Diagnosis:  Midline low back pain without sciatica  Midline thoracic back pain      Subjective Assessment - 02/13/15 1055    Symptoms Doing fine.   Currently in Pain? Yes   Pain Score 5    Pain Location Back   Pain Orientation Lower;Mid   Pain Onset More than a month ago          [facility] Assessment - 02/13/15 0001    ROM / Strength   AROM / PROM / Strength AROM   AROM   Overall AROM  Deficits   AROM Assessment Site Lumbar   Lumbar Flexion decreased 25%                  [facility] Adult PT Treatment/Exercise - 02/13/15 0001    Posture/Postural Control   Posture/Postural Control Postural limitations   Postural Limitations Rounded Shoulders;Forward head;Decreased lumbar lordosis   Posture Comments very posture souched sitting   Exercises   Exercises Lumbar   Lumbar Exercises: Aerobic   Stationary Bike 6 minutes  Nustep level 5   Lumbar Exercises: Standing   Other Standing Lumbar Exercises postural reset at wall   Lumbar Exercises: Seated   Other Seated Lumbar Exercises pelvic tilts   Lumbar Exercises: Supine   Bridge 10 reps  2 sets   Straight Leg Raise 10 reps  2 reps   Other Supine Lumbar Exercises KTC,  rotation with ball  2x15   Lumbar Exercises: Prone   Other Prone Lumbar Exercises Prone on elbows   Modalities   Modalities Electrical Stimulation;Moist Heat   Moist Heat Therapy   Number Minutes Moist Heat 15 Minutes   Moist Heat Location Other (comment)  lumbar   Electrical Stimulation   Electrical Stimulation Location low back   Electrical Stimulation Parameters IFC   Electrical Stimulation Goals Pain                PT Education - 02/13/15 1130    Education Details Posture, body mechanics, lifting.  Lumbar roll from towel   Person(s) Educated Patient   Methods Explanation;Demonstration   Comprehension Verbalized understanding          PT Short Term Goals - 02/13/15 1100    PT SHORT TERM GOAL #1   Title independent with HEP   Time 2   Period Weeks   Status Achieved           PT Long Term Goals - 02/13/15 1101    PT LONG TERM GOAL #1   Title independent with proper posture and body mechanics   Time 8   Period Weeks  Status Achieved   PT LONG TERM GOAL #2   Title increase Lumbar ROM to WNL's   Time 8   Period Weeks   Status On-going   PT LONG TERM GOAL #3   Title decrease pain 50%   Time 8   Period Weeks   Status On-going               Plan - 02/13/15 1146    Clinical Impression Statement Slouched posture with rounded shoulders and decreased lordosis. Able to lift BLE's for placement on ball and on roll during modalities.   Pt will benefit from skilled therapeutic intervention in order to improve on the following deficits Decreased range of motion;Pain;Postural dysfunction;Decreased strength;Increased muscle spasms   Rehab Potential Good   PT Frequency 2x / week   PT Duration 8 weeks   PT Treatment/Interventions Moist Heat;Therapeutic activities;Therapeutic exercise;Patient/family education;Manual techniques   PT Next Visit Plan Continue to strengthen core and improve posture.   Consulted and Agree with Plan of Care Patient         Problem List There are no active problems to display for this patient.   Nichola Cieslinski PTA 02/13/2015, 11:48 AM  [facility] [address] [place], [place], [address] Phone: [phone]   Fax:  [phone]

## 2015-02-18 ENCOUNTER — Encounter: Payer: Self-pay | Admitting: Physical Therapy

## 2015-02-18 ENCOUNTER — Ambulatory Visit: Payer: No Typology Code available for payment source | Admitting: Physical Therapy

## 2015-02-18 DIAGNOSIS — M545 Low back pain, unspecified: Secondary | ICD-10-CM

## 2015-02-18 DIAGNOSIS — M546 Pain in thoracic spine: Secondary | ICD-10-CM

## 2015-02-18 NOTE — Therapy (Signed)
[facility] [address] [place], [place], [address] Phone: [phone]   Fax:  [phone]  Physical Therapy Treatment  Patient Details  Name: Kimberly PavlovSiera L Peavler MRN: [id number] Date of Birth: [birth year] Referring Provider:  Pete GlatterFricke, Jeffrey, DC  Encounter Date: 02/18/2015      PT End of Session - 02/18/15 1643    Visit Number 3   Number of Visits 16   PT Start Time 1530   PT Stop Time 1645   PT Time Calculation (min) 75 min   Activity Tolerance Patient tolerated treatment well      Past Medical History  Diagnosis Date  . Asthma     Past Surgical History  Procedure Laterality Date  . Anterior cruciate ligament repair      There were no vitals filed for this visit.  Visit Diagnosis:  Midline low back pain without sciatica  Midline thoracic back pain      Subjective Assessment - 02/18/15 1529    Symptoms Doing fine.   Currently in Pain? Yes   Pain Score 2    Multiple Pain Sites No                       [facility] Adult PT Treatment/Exercise - 02/18/15 0001    Exercises   Exercises Lumbar   Lumbar Exercises: Aerobic   Stationary Bike 6 minutes   Lumbar Exercises: Standing   Other Standing Lumbar Exercises 5# bilateral pull to midline  2x10   Lumbar Exercises: Supine   Bridge 15 reps  2 sets with ball   Straight Leg Raise 10 reps  with abd/add 2x10   Other Supine Lumbar Exercises KTC, rotation with ball  2x15   Lumbar Exercises: Quadruped   Opposite Arm/Leg Raise 10 reps  2 sets   Plank 30 seconds  2 sets   Other Quadruped Lumbar Exercises mt. climbers, double KTC  30 seconds each   Modalities   Modalities Electrical Stimulation;Moist Heat   Moist Heat Therapy   Number Minutes Moist Heat 15 Minutes   Moist Heat Location Other (comment)  lumbar   Electrical Stimulation   Electrical Stimulation Location low back   Electrical Stimulation Parameters IFC   Electrical Stimulation  Goals Pain                PT Education - 02/18/15 1645    Education provided Yes   Education Details Patient given red theraband and shown scap stabilization exercises.   Person(s) Educated Patient;Other (comment)   Methods Explanation;Demonstration;Tactile cues;Verbal cues   Comprehension Verbalized understanding;Returned demonstration          PT Short Term Goals - 02/13/15 1100    PT SHORT TERM GOAL #1   Title independent with HEP   Time 2   Period Weeks   Status Achieved           PT Long Term Goals - 02/13/15 1101    PT LONG TERM GOAL #1   Title independent with proper posture and body mechanics   Time 8   Period Weeks   Status Achieved   PT LONG TERM GOAL #2   Title increase Lumbar ROM to WNL's   Time 8   Period Weeks   Status On-going   PT LONG TERM GOAL #3   Title decrease pain 50%   Time 8   Period Weeks   Status On-going  Plan - 02/18/15 1649    Clinical Impression Statement Slouched posture with rounded shoulders and decreased lordosis.  Winging scapula and decreased strength in UE's.   Pt will benefit from skilled therapeutic intervention in order to improve on the following deficits Decreased range of motion;Pain;Postural dysfunction;Decreased strength;Increased muscle spasms   Rehab Potential Good   PT Frequency 2x / week   PT Duration 8 weeks   PT Treatment/Interventions Moist Heat;Therapeutic activities;Therapeutic exercise;Patient/family education;Manual techniques   PT Next Visit Plan Continue to strengthen core and improve posture.   Consulted and Agree with Plan of Care Patient        Problem List There are no active problems to display for this patient.   Zayne Draheim PTA 02/18/2015, 4:50 PM  [facility] [address] [place], [place], [address] Phone: [phone]   Fax:  [phone]

## 2015-02-20 ENCOUNTER — Ambulatory Visit: Payer: No Typology Code available for payment source | Admitting: Physical Therapy

## 2015-02-20 ENCOUNTER — Encounter: Payer: Self-pay | Admitting: Physical Therapy

## 2015-02-20 DIAGNOSIS — M545 Low back pain, unspecified: Secondary | ICD-10-CM

## 2015-02-20 DIAGNOSIS — M546 Pain in thoracic spine: Secondary | ICD-10-CM

## 2015-02-20 NOTE — Therapy (Signed)
[place], [place], [address] Phone: [phone]   Fax:  [phone]  Physical Therapy Treatment  Patient Details  Name: LEEAN AMEZCUA MRN: [id number] Date of Birth: 1991-07-19 Referring Provider:  Reinaldo Meeker, DC  Encounter Date: 02/20/2015      PT End of Session - 02/20/15 1603    Visit Number 4   Number of Visits 16   PT Start Time 8832   PT Stop Time 1603   PT Time Calculation (min) 33 min   Activity Tolerance Patient limited by fatigue   Behavior During Therapy [facility] for tasks assessed/performed      Past Medical History  Diagnosis Date  . Asthma     Past Surgical History  Procedure Laterality Date  . Anterior cruciate ligament repair      There were no vitals filed for this visit.  Visit Diagnosis:  Midline low back pain without sciatica  Midline thoracic back pain      Subjective Assessment - 02/20/15 1529    Symptoms Doing great.   Currently in Pain? No/denies   Multiple Pain Sites No                       [facility] Adult PT Treatment/Exercise - 02/20/15 0001    Exercises   Exercises Lumbar   Lumbar Exercises: Aerobic   Elliptical 6 minutes  incline 10; resistance 5   Lumbar Exercises: Machines for Strengthening   Cybex Knee Extension 10# 2x15   Cybex Knee Flexion 25# 2x15   Lumbar Exercises: Standing   Other Standing Lumbar Exercises 4# overhead mat squats, 8#elbow to knee   Other Standing Lumbar Exercises 4# mat squats   Lumbar Exercises: Supine   Other Supine Lumbar Exercises sock pulls, flutters   Other Supine Lumbar Exercises sit ups with ball transfer  yellow ball 2x10                  PT Short Term Goals - 02/13/15 1100    PT SHORT TERM GOAL #1   Title independent with HEP   Time 2   Period Weeks   Status Achieved           PT Long Term Goals - 02/20/15 1604    PT LONG TERM GOAL #1   Title independent with proper  posture and body mechanics   Time 8   Period Weeks   Status Achieved   PT LONG TERM GOAL #2   Title increase Lumbar ROM to WNL's   Time 8   Period Weeks   Status Achieved   PT LONG TERM GOAL #3   Title decrease pain 50%   Time 8   Period Weeks   Status Achieved               Plan - 02/20/15 1555    Clinical Impression Statement All goals met.   Rehab Potential Good   PT Next Visit Plan Discharge.   Consulted and Agree with Plan of Care Patient        Problem List There are no active problems to display for this patient.   Atarah Cadogan PTA 02/20/2015, 4:09 PM  [facility] [place] [address] [place], [place], [address] Phone: [phone]   Fax:  [phone]

## 2015-02-26 ENCOUNTER — Ambulatory Visit: Payer: No Typology Code available for payment source | Admitting: Physical Therapy

## 2015-02-28 ENCOUNTER — Ambulatory Visit: Payer: No Typology Code available for payment source | Admitting: Physical Therapy

## 2015-03-06 ENCOUNTER — Emergency Department (HOSPITAL_BASED_OUTPATIENT_CLINIC_OR_DEPARTMENT_OTHER): Payer: Self-pay

## 2015-03-06 ENCOUNTER — Encounter (HOSPITAL_BASED_OUTPATIENT_CLINIC_OR_DEPARTMENT_OTHER): Payer: Self-pay

## 2015-03-06 ENCOUNTER — Emergency Department (HOSPITAL_BASED_OUTPATIENT_CLINIC_OR_DEPARTMENT_OTHER)
Admission: EM | Admit: 2015-03-06 | Discharge: 2015-03-06 | Discharge status: Against Medical Advice | Payer: Self-pay | Attending: Emergency Medicine | Admitting: Emergency Medicine

## 2015-03-06 DIAGNOSIS — Y998 Other external cause status: Secondary | ICD-10-CM | POA: Insufficient documentation

## 2015-03-06 DIAGNOSIS — S6991XA Unspecified injury of right wrist, hand and finger(s), initial encounter: Secondary | ICD-10-CM | POA: Insufficient documentation

## 2015-03-06 DIAGNOSIS — Y92008 Other place in unspecified non-institutional (private) residence as the place of occurrence of the external cause: Secondary | ICD-10-CM | POA: Insufficient documentation

## 2015-03-06 DIAGNOSIS — J45909 Unspecified asthma, uncomplicated: Secondary | ICD-10-CM | POA: Insufficient documentation

## 2015-03-06 DIAGNOSIS — X58XXXA Exposure to other specified factors, initial encounter: Secondary | ICD-10-CM | POA: Insufficient documentation

## 2015-03-06 DIAGNOSIS — Y9389 Activity, other specified: Secondary | ICD-10-CM | POA: Insufficient documentation
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
## 2015-03-06 NOTE — ED Notes (Signed)
Pt not in ED room or in lobby.

## 2015-06-12 ENCOUNTER — Emergency Department (HOSPITAL_BASED_OUTPATIENT_CLINIC_OR_DEPARTMENT_OTHER)
Admission: EM | Admit: 2015-06-12 | Discharge: 2015-06-12 | Disposition: A | Discharge status: Home / Self Care | Payer: Self-pay | Attending: Emergency Medicine | Admitting: Emergency Medicine

## 2015-06-12 ENCOUNTER — Encounter (HOSPITAL_BASED_OUTPATIENT_CLINIC_OR_DEPARTMENT_OTHER): Payer: Self-pay | Admitting: Emergency Medicine

## 2015-06-12 DIAGNOSIS — Y9389 Activity, other specified: Secondary | ICD-10-CM | POA: Insufficient documentation

## 2015-06-12 DIAGNOSIS — J45909 Unspecified asthma, uncomplicated: Secondary | ICD-10-CM | POA: Insufficient documentation

## 2015-06-12 DIAGNOSIS — Y92410 Unspecified street and highway as the place of occurrence of the external cause: Secondary | ICD-10-CM | POA: Insufficient documentation

## 2015-06-12 DIAGNOSIS — T148XXA Other injury of unspecified body region, initial encounter: Secondary | ICD-10-CM

## 2015-06-12 DIAGNOSIS — Y998 Other external cause status: Secondary | ICD-10-CM | POA: Insufficient documentation

## 2015-06-12 DIAGNOSIS — T148 Other injury of unspecified body region: Secondary | ICD-10-CM | POA: Insufficient documentation

## 2015-06-12 DIAGNOSIS — S3992XA Unspecified injury of lower back, initial encounter: Secondary | ICD-10-CM | POA: Insufficient documentation

## 2015-06-12 DIAGNOSIS — S4992XA Unspecified injury of left shoulder and upper arm, initial encounter: Secondary | ICD-10-CM | POA: Insufficient documentation

## 2015-06-12 DIAGNOSIS — S4991XA Unspecified injury of right shoulder and upper arm, initial encounter: Secondary | ICD-10-CM | POA: Insufficient documentation

## 2015-06-12 MED ORDER — ACETAMINOPHEN 500 MG PO TABS
1000.0000 mg | ORAL_TABLET | Freq: Once | ORAL | Status: AC
  Administered 2015-06-12: 1000 mg via ORAL
  Filled 2015-06-12: qty 2

## 2015-06-12 MED ORDER — METHOCARBAMOL 500 MG PO TABS
1000.0000 mg | ORAL_TABLET | Freq: Once | ORAL | Status: AC
  Administered 2015-06-12: 1000 mg via ORAL
  Filled 2015-06-12: qty 2

## 2015-06-12 MED ORDER — METHOCARBAMOL 500 MG PO TABS: 1000.0000 mg | ORAL_TABLET | Freq: Three times a day (TID) | ORAL | Status: DC | PRN
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
## 2015-06-13 ENCOUNTER — Encounter (HOSPITAL_BASED_OUTPATIENT_CLINIC_OR_DEPARTMENT_OTHER): Payer: Self-pay

## 2015-06-13 ENCOUNTER — Emergency Department (HOSPITAL_BASED_OUTPATIENT_CLINIC_OR_DEPARTMENT_OTHER)
Admission: EM | Admit: 2015-06-13 | Discharge: 2015-06-13 | Disposition: A | Discharge status: Home / Self Care | Payer: No Typology Code available for payment source | Attending: Emergency Medicine | Admitting: Emergency Medicine

## 2015-06-13 DIAGNOSIS — Z87891 Personal history of nicotine dependence: Secondary | ICD-10-CM | POA: Insufficient documentation

## 2015-06-13 DIAGNOSIS — Y9389 Activity, other specified: Secondary | ICD-10-CM | POA: Diagnosis not present

## 2015-06-13 DIAGNOSIS — J45909 Unspecified asthma, uncomplicated: Secondary | ICD-10-CM | POA: Diagnosis not present

## 2015-06-13 DIAGNOSIS — S060X0A Concussion without loss of consciousness, initial encounter: Secondary | ICD-10-CM | POA: Diagnosis not present

## 2015-06-13 DIAGNOSIS — Y92410 Unspecified street and highway as the place of occurrence of the external cause: Secondary | ICD-10-CM | POA: Diagnosis not present

## 2015-06-13 DIAGNOSIS — S0990XA Unspecified injury of head, initial encounter: Secondary | ICD-10-CM | POA: Diagnosis present

## 2015-06-13 DIAGNOSIS — Y998 Other external cause status: Secondary | ICD-10-CM | POA: Diagnosis not present

## 2015-06-13 MED ORDER — ACETAMINOPHEN 325 MG PO TABS
650.0000 mg | ORAL_TABLET | Freq: Once | ORAL | Status: AC
  Administered 2015-06-13: 650 mg via ORAL
  Filled 2015-06-13: qty 2

## 2015-06-13 NOTE — ED Notes (Signed)
MD at bedside. 

## 2015-06-13 NOTE — Discharge Instructions (Signed)
Concussion °A concussion, or closed-head injury, is a brain injury caused by a direct blow to the head or by a quick and sudden movement (jolt) of the head or neck. Concussions are usually not life-threatening. Even so, the effects of a concussion can be serious. If you have had a concussion before, you are more likely to experience concussion-like symptoms after a direct blow to the head.  °CAUSES °· Direct blow to the head, such as from running into another player during a soccer game, being hit in a fight, or hitting your head on a hard surface. °· A jolt of the head or neck that causes the brain to move back and forth inside the skull, such as in a car crash. °SIGNS AND SYMPTOMS °The signs of a concussion can be hard to notice. Early on, they may be missed by you, family members, and health care providers. You may look fine but act or feel differently. °Symptoms are usually temporary, but they may last for days, weeks, or even longer. Some symptoms may appear right away while others may not show up for hours or days. Every head injury is different. Symptoms include: °· Mild to moderate headaches that will not go away. °· A feeling of pressure inside your head. °· Having more trouble than usual: °· Learning or remembering things you have heard. °· Answering questions. °· Paying attention or concentrating. °· Organizing daily tasks. °· Making decisions and solving problems. °· Slowness in thinking, acting or reacting, speaking, or reading. °· Getting lost or being easily confused. °· Feeling tired all the time or lacking energy (fatigued). °· Feeling drowsy. °· Sleep disturbances. °· Sleeping more than usual. °· Sleeping less than usual. °· Trouble falling asleep. °· Trouble sleeping (insomnia). °· Loss of balance or feeling lightheaded or dizzy. °· Nausea or vomiting. °· Numbness or tingling. °· Increased sensitivity to: °· Sounds. °· Lights. °· Distractions. °· Vision problems or eyes that tire  easily. °· Diminished sense of taste or smell. °· Ringing in the ears. °· Mood changes such as feeling sad or anxious. °· Becoming easily irritated or angry for little or no reason. °· Lack of motivation. °· Seeing or hearing things other people do not see or hear (hallucinations). °DIAGNOSIS °Your health care provider can usually diagnose a concussion based on a description of your injury and symptoms. He or she will ask whether you passed out (lost consciousness) and whether you are having trouble remembering events that happened right before and during your injury. °Your evaluation might include: °· A brain scan to look for signs of injury to the brain. Even if the test shows no injury, you may still have a concussion. °· Blood tests to be sure other problems are not present. °TREATMENT °· Concussions are usually treated in an emergency department, in urgent care, or at a clinic. You may need to stay in the hospital overnight for further treatment. °· Tell your health care provider if you are taking any medicines, including prescription medicines, over-the-counter medicines, and natural remedies. Some medicines, such as blood thinners (anticoagulants) and aspirin, may increase the chance of complications. Also tell your health care provider whether you have had alcohol or are taking illegal drugs. This information may affect treatment. °· Your health care provider will send you home with important instructions to follow. °· How fast you will recover from a concussion depends on many factors. These factors include how severe your concussion is, what part of your brain was injured, your   age, and how healthy you were before the concussion. °· Most people with mild injuries recover fully. Recovery can take time. In general, recovery is slower in older persons. Also, persons who have had a concussion in the past or have other medical problems may find that it takes longer to recover from their current injury. °HOME  CARE INSTRUCTIONS °General Instructions °· Carefully follow the directions your health care provider gave you. °· Only take over-the-counter or prescription medicines for pain, discomfort, or fever as directed by your health care provider. °· Take only those medicines that your health care provider has approved. °· Do not drink alcohol until your health care provider says you are well enough to do so. Alcohol and certain other drugs may slow your recovery and can put you at risk of further injury. °· If it is harder than usual to remember things, write them down. °· If you are easily distracted, try to do one thing at a time. For example, do not try to watch TV while fixing dinner. °· Talk with family members or close friends when making important decisions. °· Keep all follow-up appointments. Repeated evaluation of your symptoms is recommended for your recovery. °· Watch your symptoms and tell others to do the same. Complications sometimes occur after a concussion. Older adults with a brain injury may have a higher risk of serious complications, such as a blood clot on the brain. °· Tell your teachers, school nurse, school counselor, coach, athletic trainer, or work manager about your injury, symptoms, and restrictions. Tell them about what you can or cannot do. They should watch for: °¨ Increased problems with attention or concentration. °¨ Increased difficulty remembering or learning new information. °¨ Increased time needed to complete tasks or assignments. °¨ Increased irritability or decreased ability to cope with stress. °¨ Increased symptoms. °· Rest. Rest helps the brain to heal. Make sure you: °¨ Get plenty of sleep at night. Avoid staying up late at night. °¨ Keep the same bedtime hours on weekends and weekdays. °¨ Rest during the day. Take daytime naps or rest breaks when you feel tired. °· Limit activities that require a lot of thought or concentration. These include: °¨ Doing homework or job-related  work. °¨ Watching TV. °¨ Working on the computer. °· Avoid any situation where there is potential for another head injury (football, hockey, soccer, basketball, martial arts, downhill snow sports and horseback riding). Your condition will get worse every time you experience a concussion. You should avoid these activities until you are evaluated by the appropriate follow-up health care providers. °Returning To Your Regular Activities °You will need to return to your normal activities slowly, not all at once. You must give your body and brain enough time for recovery. °· Do not return to sports or other athletic activities until your health care provider tells you it is safe to do so. °· Ask your health care provider when you can drive, ride a bicycle, or operate heavy machinery. Your ability to react may be slower after a brain injury. Never do these activities if you are dizzy. °· Ask your health care provider about when you can return to work or school. °Preventing Another Concussion °It is very important to avoid another brain injury, especially before you have recovered. In rare cases, another injury can lead to permanent brain damage, brain swelling, or death. The risk of this is greatest during the first 7-10 days after a head injury. Avoid injuries by: °· Wearing a seat   belt when riding in a car. °· Drinking alcohol only in moderation. °· Wearing a helmet when biking, skiing, skateboarding, skating, or doing similar activities. °· Avoiding activities that could lead to a second concussion, such as contact or recreational sports, until your health care provider says it is okay. °· Taking safety measures in your home. °¨ Remove clutter and tripping hazards from floors and stairways. °¨ Use grab bars in bathrooms and handrails by stairs. °¨ Place non-slip mats on floors and in bathtubs. °¨ Improve lighting in dim areas. °SEEK MEDICAL CARE IF: °· You have increased problems paying attention or  concentrating. °· You have increased difficulty remembering or learning new information. °· You need more time to complete tasks or assignments than before. °· You have increased irritability or decreased ability to cope with stress. °· You have more symptoms than before. °Seek medical care if you have any of the following symptoms for more than 2 weeks after your injury: °· Lasting (chronic) headaches. °· Dizziness or balance problems. °· Nausea. °· Vision problems. °· Increased sensitivity to noise or light. °· Depression or mood swings. °· Anxiety or irritability. °· Memory problems. °· Difficulty concentrating or paying attention. °· Sleep problems. °· Feeling tired all the time. °SEEK IMMEDIATE MEDICAL CARE IF: °· You have severe or worsening headaches. These may be a sign of a blood clot in the brain. °· You have weakness (even if only in one hand, leg, or part of the face). °· You have numbness. °· You have decreased coordination. °· You vomit repeatedly. °· You have increased sleepiness. °· One pupil is larger than the other. °· You have convulsions. °· You have slurred speech. °· You have increased confusion. This may be a sign of a blood clot in the brain. °· You have increased restlessness, agitation, or irritability. °· You are unable to recognize people or places. °· You have neck pain. °· It is difficult to wake you up. °· You have unusual behavior changes. °· You lose consciousness. °MAKE SURE YOU: °· Understand these instructions. °· Will watch your condition. °· Will get help right away if you are not doing well or get worse. °Document Released: 02/13/2004 Document Revised: 11/28/2013 Document Reviewed: 06/15/2013 °[employer]® Patient Information ©2015 [employer]. This information is not intended to replace advice given to you by your health care provider. Make sure you discuss any questions you have with your health care provider. ° °Motor Vehicle Collision °It is common to have multiple bruises  and sore muscles after a motor vehicle collision (MVC). These tend to feel worse for the first 24 hours. You may have the most stiffness and soreness over the first several hours. You may also feel worse when you wake up the first morning after your collision. After this point, you will usually begin to improve with each day. The speed of improvement often depends on the severity of the collision, the number of injuries, and the location and nature of these injuries. °HOME CARE INSTRUCTIONS °· Put ice on the injured area. °¨ Put ice in a plastic bag. °¨ Place a towel between your skin and the bag. °¨ Leave the ice on for 15-20 minutes, 3-4 times a day, or as directed by your health care provider. °· Drink enough fluids to keep your urine clear or pale yellow. Do not drink alcohol. °· Take a warm shower or bath once or twice a day. This will increase blood flow to sore muscles. °· You may return to   activities as directed by your caregiver. Be careful when lifting, as this may aggravate neck or back pain. °· Only take over-the-counter or prescription medicines for pain, discomfort, or fever as directed by your caregiver. Do not use aspirin. This may increase bruising and bleeding. °SEEK IMMEDIATE MEDICAL CARE IF: °· You have numbness, tingling, or weakness in the arms or legs. °· You develop severe headaches not relieved with medicine. °· You have severe neck pain, especially tenderness in the middle of the back of your neck. °· You have changes in bowel or bladder control. °· There is increasing pain in any area of the body. °· You have shortness of breath, light-headedness, dizziness, or fainting. °· You have chest pain. °· You feel sick to your stomach (nauseous), throw up (vomit), or sweat. °· You have increasing abdominal discomfort. °· There is blood in your urine, stool, or vomit. °· You have pain in your shoulder (shoulder strap areas). °· You feel your symptoms are getting worse. °MAKE SURE YOU: °· Understand  these instructions. °· Will watch your condition. °· Will get help right away if you are not doing well or get worse. °Document Released: 11/23/2005 Document Revised: 04/09/2014 Document Reviewed: 04/22/2011 °[employer]® Patient Information ©2015 [employer]. This information is not intended to replace advice given to you by your health care provider. Make sure you discuss any questions you have with your health care provider. ° °
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
## 2016-01-30 ENCOUNTER — Encounter (HOSPITAL_BASED_OUTPATIENT_CLINIC_OR_DEPARTMENT_OTHER): Payer: Self-pay | Admitting: *Deleted

## 2016-01-30 ENCOUNTER — Emergency Department (HOSPITAL_BASED_OUTPATIENT_CLINIC_OR_DEPARTMENT_OTHER): Payer: Self-pay

## 2016-01-30 ENCOUNTER — Emergency Department (HOSPITAL_BASED_OUTPATIENT_CLINIC_OR_DEPARTMENT_OTHER)
Admission: EM | Admit: 2016-01-30 | Discharge: 2016-01-30 | Disposition: A | Discharge status: Home / Self Care | Payer: Self-pay | Attending: Emergency Medicine | Admitting: Emergency Medicine

## 2016-01-30 DIAGNOSIS — E860 Dehydration: Secondary | ICD-10-CM | POA: Insufficient documentation

## 2016-01-30 DIAGNOSIS — Z87891 Personal history of nicotine dependence: Secondary | ICD-10-CM | POA: Insufficient documentation

## 2016-01-30 DIAGNOSIS — R112 Nausea with vomiting, unspecified: Secondary | ICD-10-CM | POA: Insufficient documentation

## 2016-01-30 DIAGNOSIS — R197 Diarrhea, unspecified: Secondary | ICD-10-CM | POA: Insufficient documentation

## 2016-01-30 DIAGNOSIS — R000 Tachycardia, unspecified: Secondary | ICD-10-CM | POA: Insufficient documentation

## 2016-01-30 DIAGNOSIS — J45909 Unspecified asthma, uncomplicated: Secondary | ICD-10-CM | POA: Insufficient documentation

## 2016-01-30 DIAGNOSIS — B349 Viral infection, unspecified: Secondary | ICD-10-CM | POA: Insufficient documentation

## 2016-01-30 DIAGNOSIS — Z3202 Encounter for pregnancy test, result negative: Secondary | ICD-10-CM | POA: Insufficient documentation

## 2016-01-30 DIAGNOSIS — Z79899 Other long term (current) drug therapy: Secondary | ICD-10-CM | POA: Insufficient documentation

## 2016-01-30 LAB — CBC WITH DIFFERENTIAL/PLATELET
BASOS ABS: 0 10*3/uL (ref 0.0–0.1)
Basophils Relative: 0 %
Eosinophils Absolute: 0 10*3/uL (ref 0.0–0.7)
Eosinophils Relative: 0 %
HEMATOCRIT: 41.1 % (ref 36.0–46.0)
Hemoglobin: 14.6 g/dL (ref 12.0–15.0)
LYMPHS ABS: 0.2 10*3/uL — AB (ref 0.7–4.0)
LYMPHS PCT: 4 %
MCH: 33.2 pg (ref 26.0–34.0)
MCHC: 35.5 g/dL (ref 30.0–36.0)
MCV: 93.4 fL (ref 78.0–100.0)
MONOS PCT: 8 %
Monocytes Absolute: 0.5 10*3/uL (ref 0.1–1.0)
NEUTROS ABS: 5.1 10*3/uL (ref 1.7–7.7)
Neutrophils Relative %: 88 %
PLATELETS: 140 10*3/uL — AB (ref 150–400)
RBC: 4.4 MIL/uL (ref 3.87–5.11)
RDW: 11.1 % — AB (ref 11.5–15.5)
WBC: 5.8 10*3/uL (ref 4.0–10.5)

## 2016-01-30 LAB — COMPREHENSIVE METABOLIC PANEL
ALT: 15 U/L (ref 14–54)
ANION GAP: 8 (ref 5–15)
AST: 26 U/L (ref 15–41)
Albumin: 4.6 g/dL (ref 3.5–5.0)
Alkaline Phosphatase: 56 U/L (ref 38–126)
BILIRUBIN TOTAL: 0.5 mg/dL (ref 0.3–1.2)
BUN: 8 mg/dL (ref 6–20)
CHLORIDE: 104 mmol/L (ref 101–111)
CO2: 25 mmol/L (ref 22–32)
Calcium: 9.1 mg/dL (ref 8.9–10.3)
Creatinine, Ser: 0.97 mg/dL (ref 0.44–1.00)
Glucose, Bld: 134 mg/dL — ABNORMAL HIGH (ref 65–99)
POTASSIUM: 3.6 mmol/L (ref 3.5–5.1)
Sodium: 137 mmol/L (ref 135–145)
Total Protein: 7.6 g/dL (ref 6.5–8.1)

## 2016-01-30 LAB — URINALYSIS, ROUTINE W REFLEX MICROSCOPIC
Bilirubin Urine: NEGATIVE
Glucose, UA: NEGATIVE mg/dL
HGB URINE DIPSTICK: NEGATIVE
Ketones, ur: 15 mg/dL — AB
NITRITE: NEGATIVE
PROTEIN: 100 mg/dL — AB
SPECIFIC GRAVITY, URINE: 1.016 (ref 1.005–1.030)
pH: 6 (ref 5.0–8.0)

## 2016-01-30 LAB — URINE MICROSCOPIC-ADD ON

## 2016-01-30 LAB — PREGNANCY, URINE: Preg Test, Ur: NEGATIVE

## 2016-01-30 LAB — LIPASE, BLOOD: LIPASE: 25 U/L (ref 11–51)

## 2016-01-30 MED ORDER — SODIUM CHLORIDE 0.9 % IV BOLUS (SEPSIS): 1000.0000 mL | Freq: Once | INTRAVENOUS | Status: DC

## 2016-01-30 MED ORDER — ACETAMINOPHEN 500 MG PO TABS
1000.0000 mg | ORAL_TABLET | Freq: Once | ORAL | Status: AC
  Administered 2016-01-30: 1000 mg via ORAL
  Filled 2016-01-30: qty 2

## 2016-01-30 MED ORDER — SODIUM CHLORIDE 0.9 % IV BOLUS (SEPSIS)
1000.0000 mL | Freq: Once | INTRAVENOUS | Status: AC
  Administered 2016-01-30: 1000 mL via INTRAVENOUS

## 2016-01-30 MED ORDER — ONDANSETRON HCL 4 MG/2ML IJ SOLN
4.0000 mg | Freq: Once | INTRAMUSCULAR | Status: AC
  Administered 2016-01-30: 4 mg via INTRAVENOUS
  Filled 2016-01-30: qty 2

## 2016-01-30 MED ORDER — METOCLOPRAMIDE HCL 10 MG PO TABS: 10.0000 mg | ORAL_TABLET | Freq: Four times a day (QID) | ORAL | Status: DC | PRN

## 2016-01-30 MED FILL — METOCLOPRAMIDE 10 MG TABLET: 10 | 2 days supply | Qty: 10 | Fill #0

## 2016-01-30 NOTE — ED Provider Notes (Signed)
CSN: [id number]     Arrival date & time 01/30/16  4098 History   First MD Initiated Contact with Patient 01/30/16 [phone]     No chief complaint on file.    (Consider location/radiation/quality/duration/timing/severity/associated sxs/prior Treatment) The history is provided by the patient.  Kimberly Golden is a 25 y.o. female hx of asthma here with headaches, cough, vomiting, diarrhea. Symptoms for the last 2 days. Patient states that she started with some myalgias as well as headaches and subjective fevers. Also has nonproductive cough as well. Has been using her albuterol inhaler with minimal relief.  Patient has been feeling nauseated and vomited once today. Had several episodes diarrhea yesterday. States his sister was sick as well. Denies any exposure to flu. She is a former smoker. Sexually active with female only.    Past Medical History  Diagnosis Date  . Asthma    Past Surgical History  Procedure Laterality Date  . Anterior cruciate ligament repair     No family history on file. Social History  Substance Use Topics  . Smoking status: Former Games developer  . Smokeless tobacco: Never Used  . Alcohol Use: No   OB History    No data available     Review of Systems  Respiratory: Positive for cough.   Gastrointestinal: Positive for vomiting.  All other systems reviewed and are negative.     Allergies  Flagyl and Ibuprofen  Home Medications   Prior to Admission medications   Medication Sig Start Date End Date Taking? Authorizing Provider  albuterol (PROVENTIL HFA;VENTOLIN HFA) 108 (90 Base) MCG/ACT inhaler Inhale into the lungs every 6 (six) hours as needed for wheezing or shortness of breath.   Yes Historical Provider, MD   BP 114/78 mmHg  Pulse 98  Temp(Src) 101 F (38.3 C) (Oral)  Resp 20  Ht  (1.6 m)  Wt 115 lb (52.164 kg)  BMI 20.38 kg/m2  SpO2 99%  LMP 01/18/2016 Physical Exam  Constitutional: She is oriented to person, place, and time.  Dehydrated    HENT:  Head: Normocephalic.  MM dry   Eyes: Conjunctivae are normal. Pupils are equal, round, and reactive to light.  Neck: Normal range of motion. Neck supple.  Cardiovascular: Regular rhythm and normal heart sounds.   Slightly tachy   Pulmonary/Chest: Effort normal and breath sounds normal. No respiratory distress. She has no wheezes. She has no rales.  Abdominal: Soft. Bowel sounds are normal. She exhibits no distension. There is no tenderness. There is no rebound.  Musculoskeletal: Normal range of motion. She exhibits no edema.  Neurological: She is alert and oriented to person, place, and time. No cranial nerve deficit. Coordination normal.  Skin: Skin is warm and dry.  Psychiatric: She has a normal mood and affect. Her behavior is normal. Judgment and thought content normal.  Nursing note and vitals reviewed.   ED Course  Procedures (including critical care time) Labs Review Labs Reviewed  CBC WITH DIFFERENTIAL/PLATELET - Abnormal; Notable for the following:    RDW 11.1 (*)    Platelets 140 (*)    Lymphs Abs 0.2 (*)    All other components within normal limits  COMPREHENSIVE METABOLIC PANEL - Abnormal; Notable for the following:    Glucose, Bld 134 (*)    All other components within normal limits  URINALYSIS, ROUTINE W REFLEX MICROSCOPIC (NOT AT [facility]) - Abnormal; Notable for the following:    Ketones, ur 15 (*)    Protein, ur 100 (*)  Leukocytes, UA SMALL (*)    All other components within normal limits  URINE MICROSCOPIC-ADD ON - Abnormal; Notable for the following:    Squamous Epithelial / LPF 0-5 (*)    Bacteria, UA FEW (*)    All other components within normal limits  LIPASE, BLOOD  PREGNANCY, URINE    Imaging Review Dg Chest 2 View  01/30/2016  CLINICAL DATA:  25 year old with fever, chills, cough and diarrhea for 2 days. EXAM: CHEST  2 VIEW COMPARISON:  Radiographs 08/28/2014. FINDINGS: The heart size and mediastinal contours are normal. The lungs are clear.  There is no pleural effusion or pneumothorax. No acute osseous findings are identified. IMPRESSION: Stable chest.  No active cardiopulmonary process. Electronically Signed   By: Carey Bullocks M.D.   On: 01/30/2016 11:31   I have personally reviewed and evaluated these images and lab results as part of my medical decision-making.   EKG Interpretation None      MDM   Final diagnoses:  None    Kimberly Golden is a 25 y.o. female here with viral syndrome. No meningeal signs. Appears slightly dehydrated. Will get labs, CXR, UA. Will hydrate and reassess.   12:37 PM WBC nl. Felt better now with IVF. UA showed some ketones but no obvious UTI. CXR clear. Likely viral syndrome. Recommend tylenol, motrin, hydration at home.     Richardean Canal, MD 01/30/16 [phone]

## 2016-01-30 NOTE — ED Notes (Signed)
C/o fever, chills, coughing and diarrhea with vomiting since Tuesday.

## 2016-01-30 NOTE — Discharge Instructions (Signed)
Stay hydrated.   Take reglan as needed for nausea.  Take tylenol, motrin for fever or myalgias.   See your doctor.   Return to ER if you have fever for a week, trouble breathing, vomiting, dehydration.

## 2016-01-30 NOTE — ED Notes (Signed)
Asked patient is she could try for a urine sample.Patient states " I don't feel like I could do anything at all". Patient asked for some water or something to drink to help her go. Taryn RN notified.

## 2016-03-09 ENCOUNTER — Emergency Department (HOSPITAL_BASED_OUTPATIENT_CLINIC_OR_DEPARTMENT_OTHER)
Admission: EM | Admit: 2016-03-09 | Discharge: 2016-03-09 | Disposition: A | Discharge status: Home / Self Care | Payer: No Typology Code available for payment source | Attending: Emergency Medicine | Admitting: Emergency Medicine

## 2016-03-09 ENCOUNTER — Encounter (HOSPITAL_BASED_OUTPATIENT_CLINIC_OR_DEPARTMENT_OTHER): Payer: Self-pay

## 2016-03-09 DIAGNOSIS — Z87891 Personal history of nicotine dependence: Secondary | ICD-10-CM | POA: Insufficient documentation

## 2016-03-09 DIAGNOSIS — Z79899 Other long term (current) drug therapy: Secondary | ICD-10-CM | POA: Insufficient documentation

## 2016-03-09 DIAGNOSIS — K529 Noninfective gastroenteritis and colitis, unspecified: Secondary | ICD-10-CM | POA: Insufficient documentation

## 2016-03-09 DIAGNOSIS — J45909 Unspecified asthma, uncomplicated: Secondary | ICD-10-CM | POA: Insufficient documentation

## 2016-03-09 DIAGNOSIS — Z3202 Encounter for pregnancy test, result negative: Secondary | ICD-10-CM | POA: Insufficient documentation

## 2016-03-09 LAB — PREGNANCY, URINE: Preg Test, Ur: NEGATIVE

## 2016-03-09 LAB — URINALYSIS, ROUTINE W REFLEX MICROSCOPIC
Bilirubin Urine: NEGATIVE
GLUCOSE, UA: NEGATIVE mg/dL
HGB URINE DIPSTICK: NEGATIVE
Ketones, ur: NEGATIVE mg/dL
LEUKOCYTES UA: NEGATIVE
Nitrite: NEGATIVE
Protein, ur: NEGATIVE mg/dL
SPECIFIC GRAVITY, URINE: 1.018 (ref 1.005–1.030)
pH: 7.5 (ref 5.0–8.0)

## 2016-03-09 MED ORDER — ONDANSETRON 4 MG PO TBDP: 4.0000 mg | ORAL_TABLET | Freq: Three times a day (TID) | ORAL | Status: DC | PRN

## 2016-03-09 MED ORDER — ONDANSETRON 4 MG PO TBDP
4.0000 mg | ORAL_TABLET | Freq: Once | ORAL | Status: AC
  Administered 2016-03-09: 4 mg via ORAL
  Filled 2016-03-09: qty 1

## 2016-03-09 MED ORDER — SODIUM CHLORIDE 0.9 % IV BOLUS (SEPSIS): 1000.0000 mL | Freq: Once | INTRAVENOUS | Status: DC

## 2016-03-09 MED ORDER — ONDANSETRON HCL 4 MG/2ML IJ SOLN: 4.0000 mg | Freq: Once | INTRAMUSCULAR | Status: DC

## 2016-03-09 NOTE — ED Notes (Signed)
Pt reports that she threw up water, MD notified, pt given coke to try sips at a time.

## 2016-03-09 NOTE — ED Notes (Signed)
Pt made aware to return if symptoms worsen or if any life threatening symptoms occur.   

## 2016-03-09 NOTE — ED Notes (Signed)
C/i n/v/d since 9am-NAD-steady gait

## 2016-03-09 NOTE — ED Provider Notes (Signed)
CSN: [id number]     Arrival date & time 03/09/16  1144 History   First MD Initiated Contact with Patient 03/09/16 1157     Chief Complaint  Patient presents with  . Emesis     (Consider location/radiation/quality/duration/timing/severity/associated sxs/prior Treatment) Patient is a 25 y.o. female presenting with vomiting. The history is provided by the patient.  Emesis Severity:  Moderate Duration:  4 hours Timing:  Intermittent Quality:  Stomach contents Progression:  Unchanged Chronicity:  New Recent urination:  Normal Relieved by:  None tried Worsened by:  Liquids Ineffective treatments:  Antiemetics Associated symptoms: diarrhea   Associated symptoms: no abdominal pain, no cough, no fever and no URI   Risk factors: no alcohol use, no diabetes, not pregnant now and no sick contacts     Past Medical History  Diagnosis Date  . Asthma    Past Surgical History  Procedure Laterality Date  . Anterior cruciate ligament repair     No family history on file. Social History  Substance Use Topics  . Smoking status: Former Games developer  . Smokeless tobacco: Never Used  . Alcohol Use: No   OB History    No data available     Review of Systems  Gastrointestinal: Positive for vomiting and diarrhea. Negative for abdominal pain.  All other systems reviewed and are negative.     Allergies  Flagyl and Ibuprofen  Home Medications   Prior to Admission medications   Medication Sig Start Date End Date Taking? Authorizing Provider  albuterol (PROVENTIL HFA;VENTOLIN HFA) 108 (90 Base) MCG/ACT inhaler Inhale into the lungs every 6 (six) hours as needed for wheezing or shortness of breath.    Historical Provider, MD  metoCLOPramide (REGLAN) 10 MG tablet Take 1 tablet (10 mg total) by mouth every 6 (six) hours as needed for nausea. 01/30/16   Richardean Canal, MD   BP 118/86 mmHg  Pulse 80  Temp(Src) 98.7 F (37.1 C) (Oral)  Resp 16  Ht  (1.6 m)  Wt 115 lb (52.164 kg)  BMI 20.38  kg/m2  SpO2 100%  LMP 03/07/2016 Physical Exam  Constitutional: She is oriented to person, place, and time. She appears well-developed and well-nourished. No distress.  HENT:  Head: Normocephalic and atraumatic.  Mouth/Throat: Oropharynx is clear and moist.  Eyes: Conjunctivae and EOM are normal. Pupils are equal, round, and reactive to light.  Neck: Normal range of motion. Neck supple.  Cardiovascular: Normal rate, regular rhythm and intact distal pulses.   No murmur heard. Pulmonary/Chest: Effort normal and breath sounds normal. No respiratory distress. She has no wheezes. She has no rales.  Abdominal: Soft. She exhibits no distension. There is no tenderness. There is no rebound and no guarding.  Musculoskeletal: Normal range of motion. She exhibits no edema or tenderness.  Neurological: She is alert and oriented to person, place, and time.  Skin: Skin is warm and dry. No rash noted. No erythema.  Psychiatric: She has a normal mood and affect. Her behavior is normal.  Nursing note and vitals reviewed.   ED Course  Procedures (including critical care time) Labs Review Labs Reviewed  URINALYSIS, ROUTINE W REFLEX MICROSCOPIC (NOT AT [facility])  PREGNANCY, URINE    Imaging Review No results found. I have personally reviewed and evaluated these images and lab results as part of my medical decision-making.   EKG Interpretation None      MDM   Final diagnoses:  Gastroenteritis    Pt with symptoms most consistent with  a viral process with vomitting/diarrhea.  Denies bad food exposure and recent travel out of the country.  No recent abx.  No hx concerning for GU pathology or kidney stones.  Pt is awake and alert on exam without peritoneal signs.  Will give ODT zofran and po challenge.  2:20 PM Tolerating po's and pt d/ced home   Gwyneth SproutWhitney Lakota Markgraf, MD 03/09/16 1420

## 2016-03-09 NOTE — ED Notes (Signed)
Water given to pt 

## 2016-03-09 NOTE — ED Notes (Signed)
Pt holding coke down without emesis.

## 2016-04-16 ENCOUNTER — Encounter (HOSPITAL_BASED_OUTPATIENT_CLINIC_OR_DEPARTMENT_OTHER): Payer: Self-pay

## 2016-04-16 ENCOUNTER — Emergency Department (HOSPITAL_BASED_OUTPATIENT_CLINIC_OR_DEPARTMENT_OTHER)
Admission: EM | Admit: 2016-04-16 | Discharge: 2016-04-16 | Disposition: A | Discharge status: Home / Self Care | Payer: No Typology Code available for payment source | Attending: Emergency Medicine | Admitting: Emergency Medicine

## 2016-04-16 DIAGNOSIS — J45909 Unspecified asthma, uncomplicated: Secondary | ICD-10-CM | POA: Insufficient documentation

## 2016-04-16 DIAGNOSIS — Z87891 Personal history of nicotine dependence: Secondary | ICD-10-CM | POA: Insufficient documentation

## 2016-04-16 DIAGNOSIS — N390 Urinary tract infection, site not specified: Secondary | ICD-10-CM

## 2016-04-16 DIAGNOSIS — R42 Dizziness and giddiness: Secondary | ICD-10-CM

## 2016-04-16 LAB — URINALYSIS, ROUTINE W REFLEX MICROSCOPIC
BILIRUBIN URINE: NEGATIVE
GLUCOSE, UA: NEGATIVE mg/dL
KETONES UR: NEGATIVE mg/dL
Nitrite: NEGATIVE
PROTEIN: NEGATIVE mg/dL
Specific Gravity, Urine: 1.012 (ref 1.005–1.030)
pH: 6.5 (ref 5.0–8.0)

## 2016-04-16 LAB — PREGNANCY, URINE: PREG TEST UR: NEGATIVE

## 2016-04-16 LAB — URINE MICROSCOPIC-ADD ON

## 2016-04-16 MED ORDER — FOSFOMYCIN TROMETHAMINE 3 G PO PACK
3.0000 g | PACK | Freq: Once | ORAL | Status: AC
  Administered 2016-04-16: 3 g via ORAL
  Filled 2016-04-16: qty 3

## 2016-04-16 NOTE — ED Provider Notes (Signed)
CSN: [id number]     Arrival date & time 04/16/16  4098 History   First MD Initiated Contact with Patient 04/16/16 [phone]     Chief Complaint  Patient presents with  . 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 At work yesterday evening beginning about 7 PM she felt hot and was sweating. She subsequently felt shaky and dizzy. She is somewhat vague about what she means by dizzy using terms like "lightheaded" and, room spinning once or twice". The symptoms were not severe. When she got up this morning she had a mild left temporal headache and a very brief episode of this same dizziness. She is asymptomatic at this time. She had diarrhea earlier in the week but none for the past 2 days. She denies nausea, vomiting, vaginal bleeding, earache, hearing changes or cold symptoms. She does keep a water bottle with her at work to keep hydrated.  Past Medical History  Diagnosis Date  . Asthma    Past Surgical History  Procedure Laterality Date  . Anterior cruciate ligament repair     No family history on file. Social History  Substance Use Topics  . Smoking status: Former Games developer  . Smokeless tobacco: Never Used  . Alcohol Use: No   OB History    No data available     Review of Systems  All other systems reviewed and are negative.   Allergies  Flagyl and Ibuprofen  Home Medications   Prior to Admission medications   Medication Sig Start Date End Date Taking? Authorizing Provider  albuterol (PROVENTIL HFA;VENTOLIN HFA) 108 (90 Base) MCG/ACT inhaler Inhale into the lungs every 6 (six) hours as needed for wheezing or shortness of breath.    Historical Provider, MD  metoCLOPramide (REGLAN) 10 MG tablet Take 1 tablet (10 mg total) by mouth every 6 (six) hours as needed for nausea. 01/30/16   Richardean Canal, MD  ondansetron (ZOFRAN ODT) 4 MG disintegrating tablet Take 1  tablet (4 mg total) by mouth every 8 (eight) hours as needed for nausea or vomiting. 03/09/16   Gwyneth Sprout, MD   BP 112/70 mmHg  Pulse 71  Temp(Src) 98.3 F (36.8 C) (Oral)  Resp 18  Ht  (1.6 m)  Wt 115 lb (52.164 kg)  BMI 20.38 kg/m2  SpO2 100%  LMP 04/09/2016   Physical Exam  General: Well-developed, well-nourished female in no acute distress; appearance consistent with age of record HENT: normocephalic; atraumatic; TMs normal; pharynx normal Eyes: pupils equal, round and reactive to light; extraocular muscles intact Neck: supple Heart: regular rate and rhythm Lungs: clear to auscultation bilaterally Abdomen: soft; nondistended; nontender; no masses or hepatosplenomegaly; bowel sounds present Extremities: No deformity; full range of motion; pulses normal Neurologic: Awake, alert and oriented; motor function intact in all extremities and symmetric; no facial droop Skin: Warm and dry Psychiatric: Normal mood and affect    ED Course  Procedures (including critical care time)   MDM  Nursing notes and vitals signs, including pulse oximetry, reviewed.  Summary of this visit's results, reviewed by myself:  Labs:  Results for orders placed or performed during the hospital encounter of 04/16/16 (from the past 24 hour(s))  Urinalysis, Routine w reflex microscopic (not at [facility])     Status: Abnormal   Collection Time: 04/16/16  5:25 AM  Result Value Ref Range   Color, Urine YELLOW YELLOW   APPearance CLOUDY (A) CLEAR  Specific Gravity, Urine 1.012 1.005 - 1.030   pH 6.5 5.0 - 8.0   Glucose, UA NEGATIVE NEGATIVE mg/dL   Hgb urine dipstick SMALL (A) NEGATIVE   Bilirubin Urine NEGATIVE NEGATIVE   Ketones, ur NEGATIVE NEGATIVE mg/dL   Protein, ur NEGATIVE NEGATIVE mg/dL   Nitrite NEGATIVE NEGATIVE   Leukocytes, UA MODERATE (A) NEGATIVE  Pregnancy, urine     Status: None   Collection Time: 04/16/16  5:25 AM  Result Value Ref Range   Preg Test, Ur NEGATIVE NEGATIVE   Urine microscopic-add on     Status: Abnormal   Collection Time: 04/16/16  5:25 AM  Result Value Ref Range   Squamous Epithelial / LPF 6-30 (A) NONE SEEN   WBC, UA 6-30 0 - 5 WBC/hpf   RBC / HPF 0-5 0 - 5 RBC/hpf   Bacteria, UA FEW (A) NONE SEEN        Paula LibraJohn Cheryln Balcom, MD 04/16/16 [phone]

## 2016-04-16 NOTE — ED Notes (Signed)
Pt was at work last night and got very hot and sweaty and "shakey" and felt "lightheaded."  Went home and went to bed and when she woke up c/o slight headache and some dizziness when she first woke.  She denies any current dizziness, just c/o slight headache and "not feeling right."

## 2016-04-16 NOTE — ED Notes (Signed)
Pt verbalizes understanding of d/c instructions and denies any further needs at this time. 

## 2016-04-17 LAB — URINE CULTURE

## 2016-06-18 ENCOUNTER — Encounter (HOSPITAL_BASED_OUTPATIENT_CLINIC_OR_DEPARTMENT_OTHER): Payer: Self-pay | Admitting: *Deleted

## 2016-06-18 ENCOUNTER — Emergency Department (HOSPITAL_BASED_OUTPATIENT_CLINIC_OR_DEPARTMENT_OTHER)
Admission: EM | Admit: 2016-06-18 | Discharge: 2016-06-18 | Disposition: A | Discharge status: Home / Self Care | Payer: No Typology Code available for payment source | Attending: Physician Assistant | Admitting: Physician Assistant

## 2016-06-18 DIAGNOSIS — J00 Acute nasopharyngitis [common cold]: Secondary | ICD-10-CM | POA: Insufficient documentation

## 2016-06-18 DIAGNOSIS — J45909 Unspecified asthma, uncomplicated: Secondary | ICD-10-CM | POA: Insufficient documentation

## 2016-06-18 DIAGNOSIS — F17200 Nicotine dependence, unspecified, uncomplicated: Secondary | ICD-10-CM | POA: Insufficient documentation

## 2016-06-18 DIAGNOSIS — IMO0001 Reserved for inherently not codable concepts without codable children: Secondary | ICD-10-CM

## 2016-06-18 LAB — RAPID STREP SCREEN (MED CTR MEBANE ONLY): STREPTOCOCCUS, GROUP A SCREEN (DIRECT): NEGATIVE

## 2016-06-18 MED ORDER — GUAIFENESIN 100 MG/5ML PO LIQD: 100.0000 mg | ORAL | Status: DC | PRN

## 2016-06-18 MED ORDER — ACETAMINOPHEN 325 MG PO TABS
650.0000 mg | ORAL_TABLET | Freq: Once | ORAL | Status: AC
  Administered 2016-06-18: 650 mg via ORAL
  Filled 2016-06-18: qty 2

## 2016-06-18 NOTE — ED Notes (Signed)
C/o sorethroat, nasal congestion with dry cough. C/o upper chest pain with coughing.

## 2016-06-18 NOTE — Discharge Instructions (Signed)

## 2016-06-18 NOTE — ED Provider Notes (Signed)
CSN: [id number]     Arrival date & time 06/18/16  [id number] History   First MD Initiated Contact with Patient 06/18/16 [phone]     Chief Complaint  Patient presents with  . Sore Throat     (Consider location/radiation/quality/duration/timing/severity/associated sxs/prior Treatment) HPI  She is a 25 year old female presenting with sore throat for last 3 days. She reports dry cough starting last night. She felt unwell enough not to go to work today. Patient has history of asthma. Has had no short of breath. No fever.  No nausea no vomiting or diarrhea. Not tried anything at home.  Past Medical History  Diagnosis Date  . Asthma    Past Surgical History  Procedure Laterality Date  . Anterior cruciate ligament repair     No family history on file. Social History  Substance Use Topics  . Smoking status: Current Every Day Smoker  . Smokeless tobacco: Never Used  . Alcohol Use: No   OB History    No data available     Review of Systems  Constitutional: Negative for activity change.  HENT: Positive for sore throat. Negative for congestion, ear pain, hearing loss, mouth sores, sinus pressure and trouble swallowing.   Respiratory: Positive for cough. Negative for shortness of breath.   Cardiovascular: Negative for chest pain.  Gastrointestinal: Negative for abdominal pain.  Neurological: Negative for dizziness.  Psychiatric/Behavioral: Negative for agitation.      Allergies  Flagyl and Ibuprofen  Home Medications   Prior to Admission medications   Not on File   BP 116/82 mmHg  Pulse 72  Temp(Src) 99.1 F (37.3 C) (Oral)  Resp 16  Ht 5\' 3"  (1.6 m)  Wt 115 lb (52.164 kg)  BMI 20.38 kg/m2  SpO2 100%  LMP 05/19/2016 Physical Exam  Constitutional: She is oriented to person, place, and time. She appears well-developed and well-nourished.  HENT:  Head: Normocephalic and atraumatic.  Right Ear: External ear normal.  Left Ear: External ear normal.  Mouth/Throat: Oropharynx  is clear and moist. No oropharyngeal exudate.  Eyes: Conjunctivae are normal. Right eye exhibits no discharge.  Neck: Neck supple.  Cardiovascular: Normal rate, regular rhythm and normal heart sounds.   No murmur heard. Pulmonary/Chest: Effort normal and breath sounds normal. She has no wheezes. She has no rales.  Abdominal: Soft. She exhibits no distension. There is no tenderness.  Musculoskeletal: Normal range of motion. She exhibits no edema.  Neurological: She is oriented to person, place, and time. No cranial nerve deficit.  Skin: Skin is warm and dry. No rash noted. She is not diaphoretic.  Psychiatric: She has a normal mood and affect. Her behavior is normal.  Nursing note and vitals reviewed.   ED Course  Procedures (including critical care time) Labs Review Labs Reviewed  RAPID STREP SCREEN (NOT AT [facility])    Imaging Review No results found. I have personally reviewed and evaluated these images and lab results as part of my medical decision-making.   EKG Interpretation None      MDM   Final diagnoses:  None    Patient is a 25 year old female presenting with sore throat 3 days and one day of dry cough. Patient's throat has no erythema. She appears in normal health. No vital sign abnormalities. Chest sounds clear.  We'll test for strep.  We will give her decongestant and work note.    Madilynn Montante Randall AnLyn Maekayla Giorgio, MD 06/18/16 [phone]

## 2016-06-20 LAB — CULTURE, GROUP A STREP (THRC)

## 2016-06-22 MED FILL — predniSONE 20 MG TABS: 20 | 5 days supply | Qty: 15 | Fill #0

## 2016-06-22 MED FILL — AZITHROMYCIN 250 MG TABLET: 250 | 5 days supply | Qty: 6 | Fill #0

## 2016-06-26 ENCOUNTER — Emergency Department (HOSPITAL_BASED_OUTPATIENT_CLINIC_OR_DEPARTMENT_OTHER): Payer: Self-pay

## 2016-06-26 ENCOUNTER — Encounter (HOSPITAL_BASED_OUTPATIENT_CLINIC_OR_DEPARTMENT_OTHER): Payer: Self-pay | Admitting: *Deleted

## 2016-06-26 ENCOUNTER — Emergency Department (HOSPITAL_BASED_OUTPATIENT_CLINIC_OR_DEPARTMENT_OTHER)
Admission: EM | Admit: 2016-06-26 | Discharge: 2016-06-26 | Disposition: A | Discharge status: Home / Self Care | Payer: Self-pay | Attending: Emergency Medicine | Admitting: Emergency Medicine

## 2016-06-26 DIAGNOSIS — J45909 Unspecified asthma, uncomplicated: Secondary | ICD-10-CM | POA: Insufficient documentation

## 2016-06-26 DIAGNOSIS — J069 Acute upper respiratory infection, unspecified: Secondary | ICD-10-CM | POA: Insufficient documentation

## 2016-06-26 MED ORDER — ALBUTEROL SULFATE HFA 108 (90 BASE) MCG/ACT IN AERS
1.0000 | INHALATION_SPRAY | Freq: Once | RESPIRATORY_TRACT | Status: AC
Start: 2016-06-26 — End: 2016-06-26
  Administered 2016-06-26: 2 via RESPIRATORY_TRACT
  Filled 2016-06-26: qty 6.7

## 2016-06-26 MED ORDER — IPRATROPIUM-ALBUTEROL 0.5-2.5 (3) MG/3ML IN SOLN
3.0000 mL | Freq: Four times a day (QID) | RESPIRATORY_TRACT | Status: DC
  Administered 2016-06-26: 3 mL via RESPIRATORY_TRACT
  Filled 2016-06-26: qty 3

## 2016-06-26 MED ORDER — BENZONATATE 100 MG PO CAPS: 100.0000 mg | ORAL_CAPSULE | Freq: Three times a day (TID) | ORAL | Status: DC

## 2016-06-26 MED FILL — BENZONATATE 100 MG CAPSULE: 100 | 7 days supply | Qty: 21 | Fill #0

## 2016-06-26 NOTE — ED Notes (Signed)
Cough fer several weeks. [facility] here and [facility]. Currently taking antibiotics and [facility] for bronch[facility]. States could not afford inhaler. Hx of asthma

## 2016-06-26 NOTE — Discharge Instructions (Signed)
Please read and follow all provided instructions.  Your diagnoses today include:  1. URI (upper respiratory infection)    Tests performed today include:  Vital signs. See below for your results today.   Medications prescribed:   Take as prescribed   Home care instructions:  Follow any educational materials contained in this packet.  Follow-up instructions: Please follow-up with your primary care provider for further evaluation of symptoms and treatment   Return instructions:   Please return to the Emergency Department if you do not get better, if you get worse, or new symptoms OR  - Fever (temperature greater than 101.57F)  - Bleeding that does not stop with holding pressure to the area    -Severe pain (please note that you may be more sore the day after your accident)  - Chest Pain  - Difficulty breathing  - Severe nausea or vomiting  - Inability to tolerate food and liquids  - Passing out  - Skin becoming red around your wounds  - Change in mental status (confusion or lethargy)  - New numbness or weakness     Please return if you have any other emergent concerns.  Additional Information:  Your vital signs today were: BP 127/93 mmHg   Pulse 48   Temp(Src) 98.5 F (36.9 C) (Oral)   Resp 20   Ht 5\' 3"  (1.6 m)   Wt 52.164 kg   BMI 20.38 kg/m2   SpO2 100%   LMP 06/20/2016 If your blood pressure (BP) was elevated above 135/85 this visit, please have this repeated by your doctor within one month. ---------------

## 2016-06-26 NOTE — ED Provider Notes (Signed)
CSN: [id number]     Arrival date & time 06/26/16  1006 History   First MD Initiated Contact with Patient 06/26/16 1019     Chief Complaint  Patient presents with  . Cough   (Consider location/radiation/quality/duration/timing/severity/associated sxs/prior Treatment) HPI 25 y.o. female with a hx of Asthma, presents to the Emergency Department today complaining of cough/shortness of breath x 2 weeks. Pt states she went to the ED on 06-18-16 and had negative strep and DCed with Robitussin. Returned to [facility] on 06-21-16 due to continued symptoms and started on Steroid, ABX and refill Albuterol. Pt currently on therapy. Pt states last night she felt like she could not breath. Unable to fill Albuterol inhaler due to cost. No pain currently. No N/V. No fevers. No CP/ABD pain. No other symptoms noted.   Past Medical History  Diagnosis Date  . Asthma    Past Surgical History  Procedure Laterality Date  . Anterior cruciate ligament repair     No family history on file. Social History  Substance Use Topics  . Smoking status: Never Smoker   . Smokeless tobacco: Never Used  . Alcohol Use: No   OB History    No data available     Review of Systems  Constitutional: Negative for fever.  HENT: Negative for congestion, ear discharge, ear pain, rhinorrhea, sinus pressure and sore throat.   Respiratory: Positive for cough and shortness of breath.   Cardiovascular: Negative for chest pain.  Gastrointestinal: Negative for nausea and vomiting.   Allergies  Flagyl and Ibuprofen  Home Medications   Prior to Admission medications   Medication Sig Start Date End Date Taking? Authorizing Provider  guaiFENesin (ROBITUSSIN) 100 MG/5ML liquid Take 5-10 mLs (100-200 mg total) by mouth every 4 (four) hours as needed for cough. 06/18/16   Courteney Lyn Mackuen, MD   BP 127/93 mmHg  Pulse 48  Temp(Src) 98.5 F (36.9 C) (Oral)  Resp 20  Ht  (1.6 m)  Wt 52.164 kg  BMI 20.38 kg/m2   SpO2 100%  LMP 06/20/2016   Physical Exam  Constitutional: She is oriented to person, place, and time. She appears well-developed and well-nourished.  HENT:  Head: Normocephalic and atraumatic.  Eyes: EOM are normal.  Neck: Normal range of motion. Neck supple.  Cardiovascular: Normal rate, regular rhythm, normal heart sounds and intact distal pulses.   No murmur heard. Pulmonary/Chest: Effort normal. No accessory muscle usage. She has no decreased breath sounds. She has wheezes in the right upper field and the left upper field. She has no rhonchi. She has no rales.  Abdominal: Soft.  Musculoskeletal: Normal range of motion.  Neurological: She is alert and oriented to person, place, and time.  Skin: Skin is warm and dry.  Psychiatric: She has a normal mood and affect. Her behavior is normal. Thought content normal.  Nursing note and vitals reviewed.  ED Course  Procedures (including critical care time) Labs Review Labs Reviewed - No data to display  Imaging Review Dg Chest 2 View  06/26/2016  CLINICAL DATA:  Cough, shortness of breath EXAM: CHEST  2 VIEW COMPARISON:  01/30/2016 FINDINGS: The heart size and mediastinal contours are within normal limits. Both lungs are clear. The visualized skeletal structures are unremarkable. IMPRESSION: No active cardiopulmonary disease. Electronically Signed   By: Elige Ko   On: 06/26/2016 11:08   I have personally reviewed and evaluated these images and lab results as part of my medical decision-making.   EKG  Interpretation None      MDM  I have reviewed and evaluated the relevant imaging studies.  I have reviewed the relevant previous healthcare records.I obtained HPI from historian.  ED Course:  Assessment: Pt is a 24yF presents with cough x 2 weeks. Seen at [facility] on 06-21-16 for similar. Given Albuterol, Steroid and ABX. Currently on therapy. Unable to fill Albuterol due to cost. Here today due to wheezing. On exam, pt in  NAD. VSS. Afebrile. Lungs CTA, Heart RRR. Abdomen nontender/soft. Pt CXR negative for acute infiltrate. Patients symptoms are consistent with URI, likely viral etiology. Pt will be discharged with symptomatic treatment. Advised to continue current therapy. Given Rx Tessalon. Verbalizes understanding and is agreeable with plan. Pt is hemodynamically stable & in NAD prior to dc.  Disposition/Plan:  DC Home Additional Verbal discharge instructions given and discussed with patient.  Pt Instructed to f/u with PCP in the next week for evaluation and treatment of symptoms. Return precautions given Pt acknowledges and agrees with plan  Supervising Physician Lyndal Pulleyaniel Knott, MD   Final diagnoses:  URI (upper respiratory infection)       Audry Piliyler Rasheedah Reis, PA-C 06/26/16 1114  Lyndal Pulleyaniel Knott, MD 06/26/16 1744

## 2016-12-06 ENCOUNTER — Emergency Department (HOSPITAL_BASED_OUTPATIENT_CLINIC_OR_DEPARTMENT_OTHER)
Admission: EM | Admit: 2016-12-06 | Discharge: 2016-12-06 | Disposition: A | Discharge status: Home / Self Care | Payer: Self-pay | Attending: Emergency Medicine | Admitting: Emergency Medicine

## 2016-12-06 ENCOUNTER — Encounter (HOSPITAL_BASED_OUTPATIENT_CLINIC_OR_DEPARTMENT_OTHER): Payer: Self-pay | Admitting: *Deleted

## 2016-12-06 DIAGNOSIS — J40 Bronchitis, not specified as acute or chronic: Secondary | ICD-10-CM | POA: Insufficient documentation

## 2016-12-06 MED ORDER — BENZONATATE 100 MG PO CAPS
100.0000 mg | ORAL_CAPSULE | Freq: Once | ORAL | Status: AC
  Administered 2016-12-06: 100 mg via ORAL
  Filled 2016-12-06: qty 1

## 2016-12-06 MED ORDER — PREDNISONE 50 MG PO TABS
60.0000 mg | ORAL_TABLET | Freq: Once | ORAL | Status: AC
  Administered 2016-12-06: 60 mg via ORAL
  Filled 2016-12-06: qty 1

## 2016-12-06 MED ORDER — IPRATROPIUM-ALBUTEROL 0.5-2.5 (3) MG/3ML IN SOLN
3.0000 mL | Freq: Four times a day (QID) | RESPIRATORY_TRACT | Status: DC
  Administered 2016-12-06: 3 mL via RESPIRATORY_TRACT
  Filled 2016-12-06: qty 3

## 2016-12-06 MED ORDER — FLUTICASONE PROPIONATE 50 MCG/ACT NA SUSP: 2.0000 | Freq: Every day | NASAL | 2 refills | Status: DC

## 2016-12-06 MED ORDER — ALBUTEROL SULFATE HFA 108 (90 BASE) MCG/ACT IN AERS: 1.0000 | INHALATION_SPRAY | Freq: Four times a day (QID) | RESPIRATORY_TRACT | 0 refills | Status: DC | PRN

## 2016-12-06 MED ORDER — BENZONATATE 100 MG PO CAPS: 100.0000 mg | ORAL_CAPSULE | Freq: Three times a day (TID) | ORAL | 0 refills | Status: DC

## 2016-12-06 MED ORDER — PREDNISONE 20 MG PO TABS: 40.0000 mg | ORAL_TABLET | Freq: Every day | ORAL | 0 refills | Status: DC

## 2016-12-06 NOTE — ED Triage Notes (Signed)
Pt reports dry cough, sore throat, and nasal congestion x1wk (states she has a fever for the first couple days but has resolved now). Also reports asthma flare-up and has been using inhaler (last used last night). Denies n/v but reports diarrhea x2days.

## 2016-12-06 NOTE — Discharge Instructions (Signed)
This is likely bronchitis. I have given you a prescription for an albuterol inhaler. I have also given you Tessalon for cough. Please take the prednisone 2 pills per day starting tomorrow for the next 3 days. You may use the Flonase as needed for nasal congestion. You may also use Sudafed over-the-counter. Return to the ED for worsening symptoms. Follow up with your primary care doctor in regards to today's visit.

## 2016-12-06 NOTE — ED Provider Notes (Signed)
[facility] Provider Note   CSN: [id number] Arrival date & time: 12/06/16  [id number]     History   Chief Complaint Chief Complaint  Patient presents with  . Cough    HPI Kimberly Golden is a 25 y.o. female.  25 year old African-American female past medical history significant for asthma presents to the ED today with URI symptoms. Patient complains of nonproductive cough, sore throat, nasal congestion 1 week. States at the beginning of her symptoms she had fever and muscle aches that has since resolved with her NyQuil. She also states that she was wheezing with her initial symptoms which has since resolved. She's been using her albuterol inhaler at home with relief. she reports sick contacts in the house. she denies any fever or muscle aches this time. She reports the continued nonproductive cough and nasal congestion. She denies any headache, vision changes, chest pain, shortness of breath, nausea, vomiting, diarrhea, urinary symptoms, or any other associated symptoms.    Cough  Associated symptoms include rhinorrhea, sore throat and wheezing. Pertinent negatives include no chest pain, no chills, no ear pain and no shortness of breath.    Past Medical History:  Diagnosis Date  . Asthma     There are no active problems to display for this patient.   Past Surgical History:  Procedure Laterality Date  . ANTERIOR CRUCIATE LIGAMENT REPAIR      OB History    No data available       Home Medications    Prior to Admission medications   Medication Sig Start Date End Date Taking? Authorizing Provider  albuterol (PROVENTIL HFA;VENTOLIN HFA) 108 (90 Base) MCG/ACT inhaler Inhale 1-2 puffs into the lungs every 6 (six) hours as needed for wheezing or shortness of breath.   Yes Historical Provider, MD  benzonatate (TESSALON) 100 MG capsule Take 1 capsule (100 mg total) by mouth every 8 (eight) hours. 06/26/16   Audry Piliyler Mohr, PA-C  guaiFENesin (ROBITUSSIN) 100 MG/5ML liquid  Take 5-10 mLs (100-200 mg total) by mouth every 4 (four) hours as needed for cough. 06/18/16   Courteney Lyn Mackuen, MD    Family History No family history on file.  Social History Social History  Substance Use Topics  . Smoking status: Never Smoker  . Smokeless tobacco: Never Used  . Alcohol use No     Allergies   Flagyl [metronidazole] and Ibuprofen   Review of Systems Review of Systems  Constitutional: Negative for chills and fever.  HENT: Positive for congestion, postnasal drip, rhinorrhea and sore throat. Negative for ear pain, sinus pain and sinus pressure.   Respiratory: Positive for cough and wheezing. Negative for shortness of breath.   Cardiovascular: Negative for chest pain.  Gastrointestinal: Negative for abdominal pain, diarrhea, nausea and vomiting.  All other systems reviewed and are negative.    Physical Exam Updated Vital Signs BP 121/87 (BP Location: Right Arm)   Pulse (!) 54   Temp 98.8 F (37.1 C) (Oral)   Resp 16   Ht 5\' 3"  (1.6 m)   Wt 54.4 kg   LMP 12/01/2016 (Exact Date)   SpO2 100%   BMI 21.26 kg/m   Physical Exam  Constitutional: She is oriented to person, place, and time. She appears well-developed and well-nourished. No distress.  HENT:  Head: Normocephalic and atraumatic.  Right Ear: Tympanic membrane, external ear and ear canal normal.  Left Ear: Tympanic membrane, external ear and ear canal normal.  Nose: Mucosal edema and rhinorrhea present. Right sinus  exhibits no maxillary sinus tenderness and no frontal sinus tenderness. Left sinus exhibits no maxillary sinus tenderness and no frontal sinus tenderness.  Mouth/Throat: Uvula is midline, oropharynx is clear and moist and mucous membranes are normal. Tonsils are 0 on the right. Tonsils are 0 on the left. No tonsillar exudate.  Eyes: Conjunctivae are normal. Pupils are equal, round, and reactive to light. Right eye exhibits no discharge. Left eye exhibits no discharge. No scleral  icterus.  Neck: Normal range of motion. Neck supple.  Cardiovascular: Normal rate and regular rhythm.   Pulmonary/Chest: Effort normal and breath sounds normal. No respiratory distress. She has no wheezes.  Lungs clear to auscultation bilaterally.  Musculoskeletal: Normal range of motion.  Lymphadenopathy:    She has no cervical adenopathy.  Neurological: She is alert and oriented to person, place, and time.  Skin: Skin is warm and dry. Capillary refill takes less than 2 seconds. No pallor.  Nursing note and vitals reviewed.    ED Treatments / Results  Labs (all labs ordered are listed, but only abnormal results are displayed) Labs Reviewed - No data to display  EKG  EKG Interpretation None       Radiology No results found.  Procedures Procedures (including critical care time)  Medications Ordered in ED Medications  ipratropium-albuterol (DUONEB) 0.5-2.5 (3) MG/3ML nebulizer solution 3 mL (3 mLs Nebulization Given 12/06/16 1005)  predniSONE (DELTASONE) tablet 60 mg (60 mg Oral Given 12/06/16 1010)  benzonatate (TESSALON) capsule 100 mg (100 mg Oral Given 12/06/16 1010)     Initial Impression / Assessment and Plan / ED Course  I have reviewed the triage vital signs and the nursing notes.  Pertinent labs & imaging results that were available during my care of the patient were reviewed by me and considered in my medical decision making (see chart for details).  Clinical Course   Patient presents to ED with nonproductive cough, sore throat, nasal congestion. Patient with history of asthma. No wheezing on exam. Clinical lungs were clear to auscultation bilaterally. Patient is not hypoxic or tachypneic. She is afebrile. This is likely a viral bronchitis. Was given a DuoNeb in the ED with significant improvement in her congestion patient states. Will be discharged home with burst of steroids, cough medicine, flonase and albuterol inhaler. The patient is nontoxic appearing.  Vital signs are stable. Pt is hemodynamically stable, in NAD, & able to ambulate in the ED. Pain has been managed & has no complaints prior to dc. Pt is comfortable with above plan and is stable for discharge at this time. All questions were answered prior to disposition. Strict return precautions for f/u to the ED were discussed.   Final Clinical Impressions(s) / ED Diagnoses   Final diagnoses:  Bronchitis    New Prescriptions New Prescriptions   ALBUTEROL (PROVENTIL HFA;VENTOLIN HFA) 108 (90 BASE) MCG/ACT INHALER    Inhale 1-2 puffs into the lungs every 6 (six) hours as needed for wheezing or shortness of breath.   BENZONATATE (TESSALON) 100 MG CAPSULE    Take 1 capsule (100 mg total) by mouth every 8 (eight) hours.   FLUTICASONE (FLONASE) 50 MCG/ACT NASAL SPRAY    Place 2 sprays into both nostrils daily.   PREDNISONE (DELTASONE) 20 MG TABLET    Take 2 tablets (40 mg total) by mouth daily with breakfast.     Rise MuKenneth T Brionna Romanek, PA-C 12/06/16 1042    Rolland PorterMark James, MD 12/19/16 [phone]

## 2017-03-05 IMAGING — DX DG CHEST 2V
2 series · 2 of 2 positions shown · non-contrast
Comparison: Radiographs 08/28/2014.

CLINICAL DATA: 24-year-old with fever, chills, cough and diarrhea
for 2 days.

EXAM:
CHEST  2 VIEW

[chest pa]
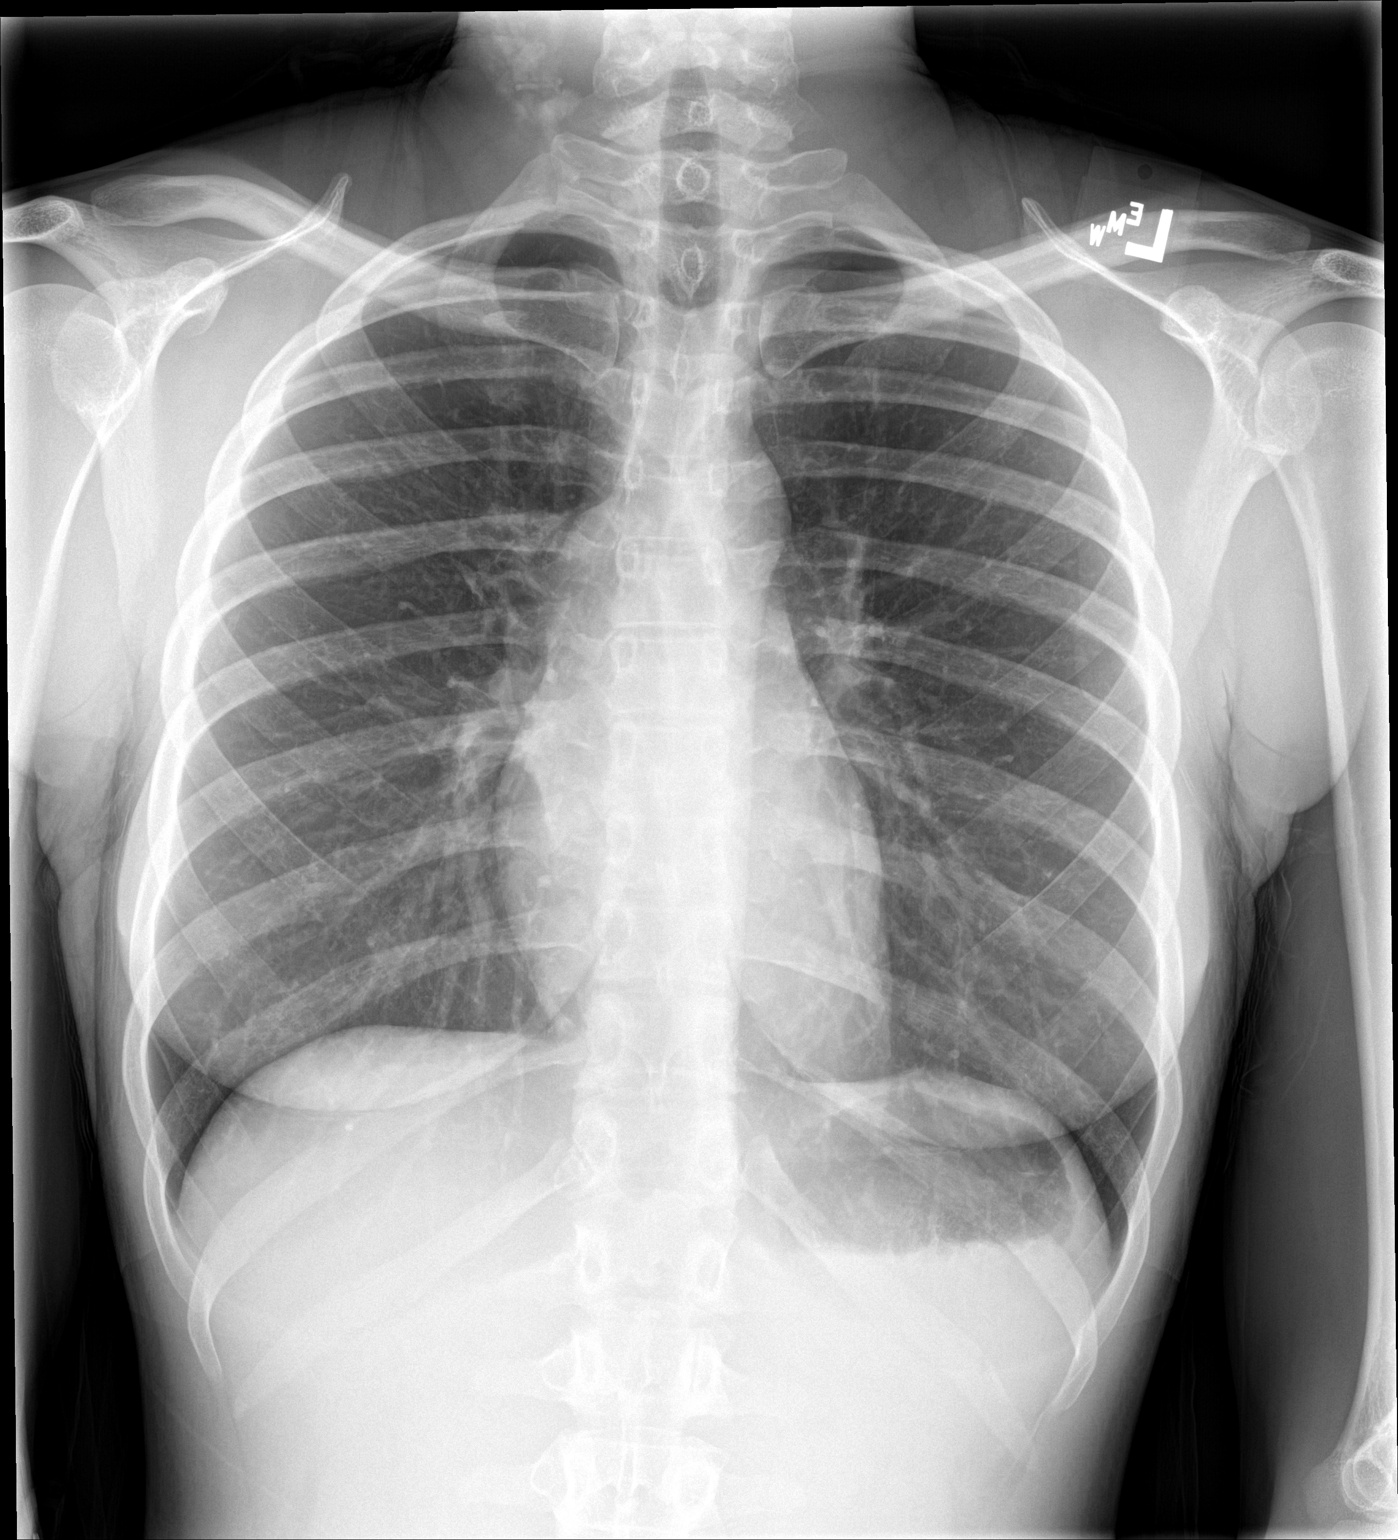

[chest lat]
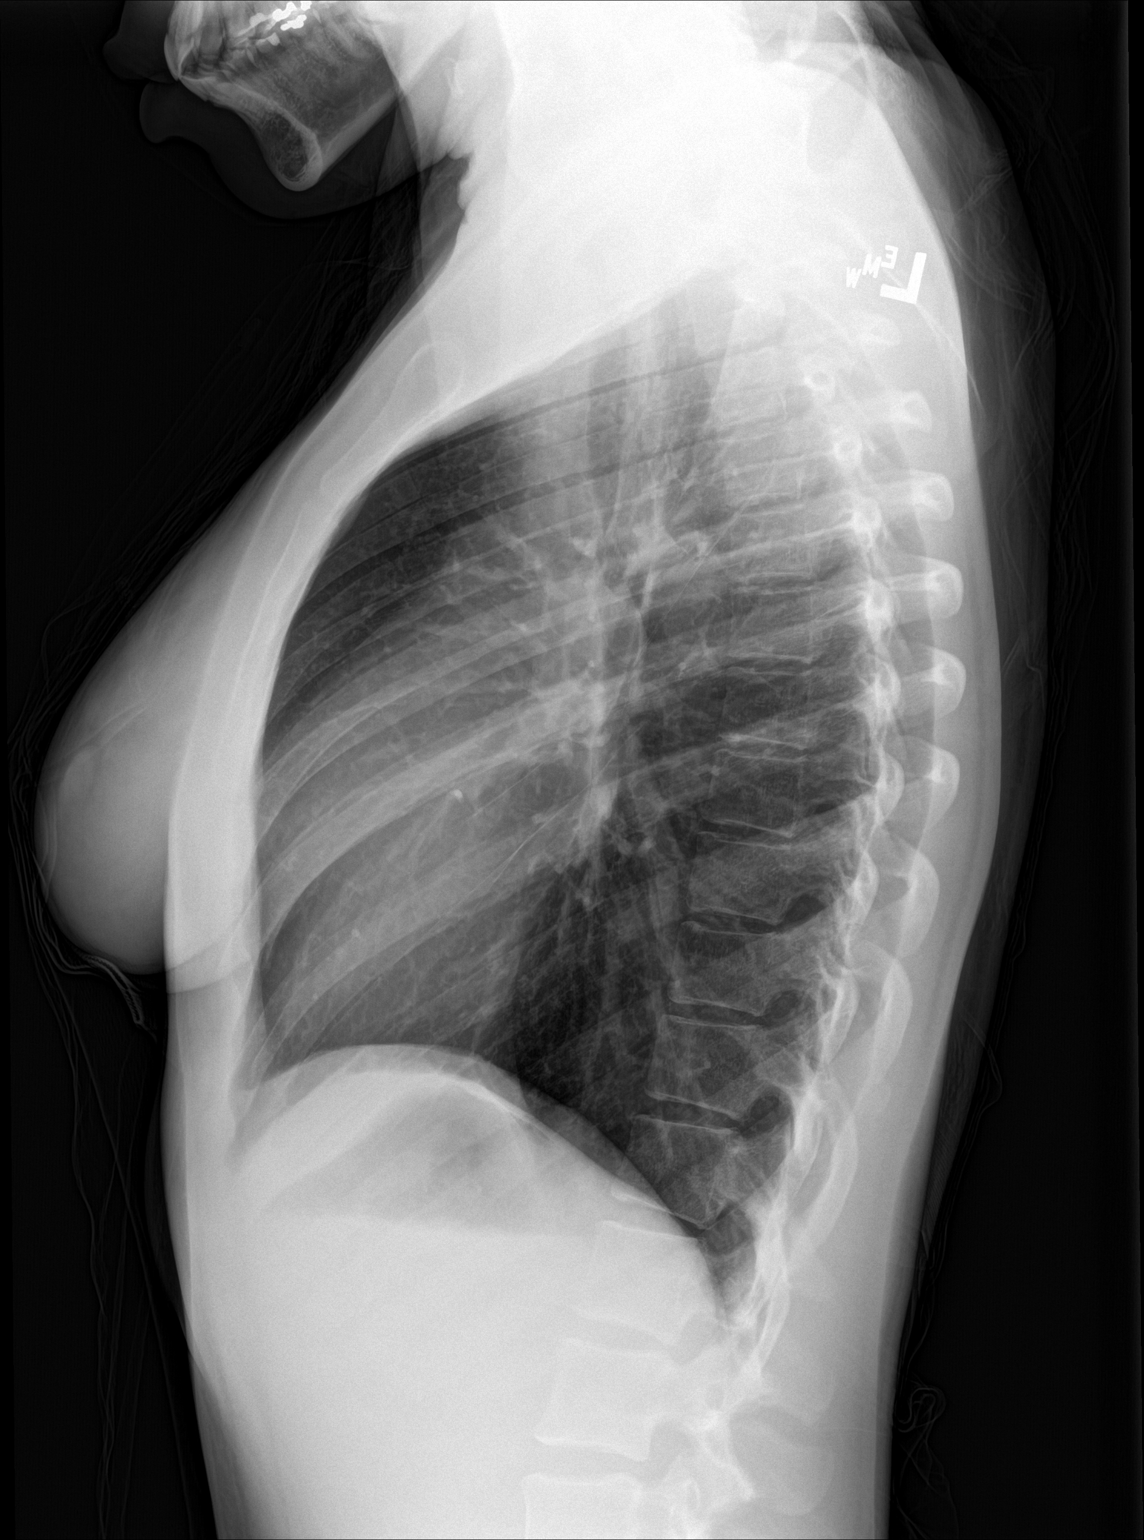

[2 of 2 positions shown; findings below may reference images not displayed]

FINDINGS: The heart size and mediastinal contours are normal. The lungs are
clear. There is no pleural effusion or pneumothorax. No acute
osseous findings are identified.
IMPRESSION: Stable chest.  No active cardiopulmonary process.

## 2017-04-16 ENCOUNTER — Encounter (HOSPITAL_BASED_OUTPATIENT_CLINIC_OR_DEPARTMENT_OTHER): Payer: Self-pay | Admitting: *Deleted

## 2017-04-16 ENCOUNTER — Emergency Department (HOSPITAL_BASED_OUTPATIENT_CLINIC_OR_DEPARTMENT_OTHER)
Admission: EM | Admit: 2017-04-16 | Discharge: 2017-04-16 | Disposition: A | Discharge status: Home / Self Care | Payer: Self-pay | Attending: Emergency Medicine | Admitting: Emergency Medicine

## 2017-04-16 DIAGNOSIS — Y999 Unspecified external cause status: Secondary | ICD-10-CM | POA: Insufficient documentation

## 2017-04-16 DIAGNOSIS — S39012A Strain of muscle, fascia and tendon of lower back, initial encounter: Secondary | ICD-10-CM | POA: Insufficient documentation

## 2017-04-16 DIAGNOSIS — J45909 Unspecified asthma, uncomplicated: Secondary | ICD-10-CM | POA: Insufficient documentation

## 2017-04-16 DIAGNOSIS — Y929 Unspecified place or not applicable: Secondary | ICD-10-CM | POA: Insufficient documentation

## 2017-04-16 DIAGNOSIS — Y939 Activity, unspecified: Secondary | ICD-10-CM | POA: Insufficient documentation

## 2017-04-16 DIAGNOSIS — X58XXXA Exposure to other specified factors, initial encounter: Secondary | ICD-10-CM | POA: Insufficient documentation

## 2017-04-16 MED ORDER — DIAZEPAM 5 MG PO TABS: 5.0000 mg | ORAL_TABLET | Freq: Two times a day (BID) | ORAL | 0 refills | Status: DC

## 2017-04-16 MED ORDER — HYDROCODONE-ACETAMINOPHEN 5-325 MG PO TABS: 1.0000 | ORAL_TABLET | ORAL | 0 refills | Status: DC | PRN

## 2017-04-16 MED FILL — HYDROCODON-APAP 5-325: 5-325 | 2 days supply | Qty: 10 | Fill #0

## 2017-04-16 MED FILL — diazePAM 5 MG TABS: 5 | 5 days supply | Qty: 10 | Fill #0

## 2017-04-16 NOTE — ED Triage Notes (Signed)
Pt reports back pain off and on "for years" states she has scoliosis, pain and spasms worse since last night. Denies injury or trauma.

## 2017-04-16 NOTE — ED Provider Notes (Signed)
[facility]   CSN: [id number] Arrival date & time: 04/16/17  1033     History   Chief Complaint Chief Complaint  Patient presents with  . Back Pain    HPI Kimberly Golden is a 26 y.o. female.  Pt presents to the ED today with back pain and spasm.  She said that she could not stand up straight since last night.  She denies urinary sx or weakness or numbness.  She has a hx of scoliosis and will occ get lbp.  She does not take any meds on a regular basis for her back pain.      Past Medical History:  Diagnosis Date  . Asthma     There are no active problems to display for this patient.   Past Surgical History:  Procedure Laterality Date  . ANTERIOR CRUCIATE LIGAMENT REPAIR      OB History    No data available       Home Medications    Prior to Admission medications   Medication Sig Start Date End Date Taking? Authorizing Provider  albuterol (PROVENTIL HFA;VENTOLIN HFA) 108 (90 Base) MCG/ACT inhaler Inhale 1-2 puffs into the lungs every 6 (six) hours as needed for wheezing or shortness of breath. 12/06/16   Rise MuLeaphart, Kenneth T, PA-C  diazepam (VALIUM) 5 MG tablet Take 1 tablet (5 mg total) by mouth 2 (two) times daily. 04/16/17   Jacalyn LefevreHaviland, Deseray Daponte, MD  HYDROcodone-acetaminophen (NORCO/VICODIN) 5-325 MG tablet Take 1 tablet by mouth every 4 (four) hours as needed. 04/16/17   Jacalyn LefevreHaviland, Evalina Tabak, MD    Family History History reviewed. No pertinent family history.  Social History Social History  Substance Use Topics  . Smoking status: Never Smoker  . Smokeless tobacco: Never Used  . Alcohol use No     Allergies   Flagyl [metronidazole] and Ibuprofen   Review of Systems Review of Systems  Musculoskeletal: Positive for back pain.  All other systems reviewed and are negative.    Physical Exam Updated Vital Signs BP 128/81 (BP Location: Left Arm)   Pulse 64   Temp 99.1 F (37.3 C) (Oral)   Resp 16   Ht 5\' 3"  (1.6 m)   Wt 120 lb  (54.4 kg)   LMP 04/14/2017   SpO2 100%   BMI 21.26 kg/m   Physical Exam  Constitutional: She is oriented to person, place, and time. She appears well-developed and well-nourished.  HENT:  Head: Normocephalic and atraumatic.  Right Ear: External ear normal.  Left Ear: External ear normal.  Nose: Nose normal.  Mouth/Throat: Oropharynx is clear and moist.  Eyes: Conjunctivae and EOM are normal. Pupils are equal, round, and reactive to light.  Neck: Normal range of motion. Neck supple.  Cardiovascular: Normal rate, regular rhythm, normal heart sounds and intact distal pulses.   Pulmonary/Chest: Effort normal and breath sounds normal.  Abdominal: Soft. Bowel sounds are normal.  Musculoskeletal: Normal range of motion.       Lumbar back: She exhibits spasm.  Neurological: She is alert and oriented to person, place, and time.  Skin: Skin is warm.  Psychiatric: She has a normal mood and affect. Her behavior is normal. Judgment and thought content normal.  Nursing note and vitals reviewed.    ED Treatments / Results  Labs (all labs ordered are listed, but only abnormal results are displayed) Labs Reviewed - No data to display  EKG  EKG Interpretation None       Radiology No  results found.  Procedures Procedures (including critical care time)  Medications Ordered in ED Medications - No data to display   Initial Impression / Assessment and Plan / ED Course  I have reviewed the triage vital signs and the nursing notes.  Pertinent labs & imaging results that were available during my care of the patient were reviewed by me and considered in my medical decision making (see chart for details).      Final Clinical Impressions(s) / ED Diagnoses   Final diagnoses:  Strain of lumbar region, initial encounter    New Prescriptions New Prescriptions   DIAZEPAM (VALIUM) 5 MG TABLET    Take 1 tablet (5 mg total) by mouth 2 (two) times daily.   HYDROCODONE-ACETAMINOPHEN  (NORCO/VICODIN) 5-325 MG TABLET    Take 1 tablet by mouth every 4 (four) hours as needed.     Jacalyn Lefevre, MD 04/16/17 1056

## 2017-04-28 ENCOUNTER — Emergency Department (HOSPITAL_BASED_OUTPATIENT_CLINIC_OR_DEPARTMENT_OTHER)
Admission: EM | Admit: 2017-04-28 | Discharge: 2017-04-28 | Disposition: A | Discharge status: Home / Self Care | Payer: Self-pay | Attending: Physician Assistant | Admitting: Physician Assistant

## 2017-04-28 ENCOUNTER — Encounter (HOSPITAL_BASED_OUTPATIENT_CLINIC_OR_DEPARTMENT_OTHER): Payer: Self-pay | Admitting: *Deleted

## 2017-04-28 DIAGNOSIS — N611 Abscess of the breast and nipple: Secondary | ICD-10-CM | POA: Insufficient documentation

## 2017-04-28 DIAGNOSIS — Z79899 Other long term (current) drug therapy: Secondary | ICD-10-CM | POA: Insufficient documentation

## 2017-04-28 DIAGNOSIS — J45909 Unspecified asthma, uncomplicated: Secondary | ICD-10-CM | POA: Insufficient documentation

## 2017-04-28 MED ORDER — LIDOCAINE HCL 2 % IJ SOLN
10.0000 mL | Freq: Once | INTRAMUSCULAR | Status: AC
  Administered 2017-04-28: 200 mg via INTRADERMAL
  Filled 2017-04-28: qty 20

## 2017-04-28 MED ORDER — DICLOXACILLIN SODIUM 500 MG PO CAPS: 500.0000 mg | ORAL_CAPSULE | Freq: Four times a day (QID) | ORAL | 0 refills | Status: AC

## 2017-04-28 MED ORDER — HYDROCODONE-ACETAMINOPHEN 5-325 MG PO TABS
1.0000 | ORAL_TABLET | Freq: Once | ORAL | Status: AC
  Administered 2017-04-28: 1 via ORAL
  Filled 2017-04-28: qty 1

## 2017-04-28 MED ORDER — HYDROCODONE-ACETAMINOPHEN 5-325 MG PO TABS: 2.0000 | ORAL_TABLET | ORAL | 0 refills | Status: DC | PRN

## 2017-04-28 NOTE — ED Notes (Signed)
ED Provider at bedside. 

## 2017-04-28 NOTE — ED Notes (Signed)
EDP remains at bedside.  Has completed I&D on right breast abscess.

## 2017-04-28 NOTE — ED Provider Notes (Signed)
[facility]   CSN: [id number] Arrival date & time: 04/28/17  1510  By signing my name below, I, Cynda Acres, attest that this documentation has been prepared under the direction and in the presence of Graciella Freer, PA-C . Electronically Signed: Cynda Acres, Scribe. 04/28/17. 4:22 PM.   History   Chief Complaint Chief Complaint  Patient presents with  . Breast Pain    HPI Comments: Kimberly Golden is a 26 y.o. female with no pertinent past medical history, who presents to the Emergency Department complaining of pain and swelling to right breast that began one week ago. Patient reports the presence of a bump on right breast that has continued to get bigger over the last week. She states that she Has experienced bumps intermittently for the past few months but normally they go away by itself. Patient states this is the first time she has experienced pain with the bump. Patient denies any injury. Patient reports applying warm compresses with no relief. Patient denies any fever,chills, nausea, vomiting, numbness, or weakness.   The history is provided by the patient. No language interpreter was used.    Past Medical History:  Diagnosis Date  . Asthma     There are no active problems to display for this patient.   Past Surgical History:  Procedure Laterality Date  . ANTERIOR CRUCIATE LIGAMENT REPAIR      OB History    No data available       Home Medications    Prior to Admission medications   Medication Sig Start Date End Date Taking? Authorizing Provider  albuterol (PROVENTIL HFA;VENTOLIN HFA) 108 (90 Base) MCG/ACT inhaler Inhale 1-2 puffs into the lungs every 6 (six) hours as needed for wheezing or shortness of breath. 12/06/16   Rise Mu, PA-C  diazepam (VALIUM) 5 MG tablet Take 1 tablet (5 mg total) by mouth 2 (two) times daily. 04/16/17   Jacalyn Lefevre, MD  dicloxacillin (DYNAPEN) 500 MG capsule Take 1 capsule (500 mg total)  by mouth 4 (four) times daily. 04/28/17 05/03/17  Maxwell Caul, PA-C  HYDROcodone-acetaminophen (NORCO/VICODIN) 5-325 MG tablet Take 2 tablets by mouth every 4 (four) hours as needed. 04/28/17   Maxwell Caul, PA-C    Family History History reviewed. No pertinent family history.  Social History Social History  Substance Use Topics  . Smoking status: Never Smoker  . Smokeless tobacco: Never Used  . Alcohol use No     Allergies   Flagyl [metronidazole] and Ibuprofen   Review of Systems Review of Systems  Constitutional: Negative for fever.  Gastrointestinal: Negative for nausea and vomiting.  Skin: Positive for color change.       Redness and swelling to right breast   Neurological: Negative for weakness and numbness.  All other systems reviewed and are negative.    Physical Exam Updated Vital Signs BP (!) 128/99 (BP Location: Left Arm)   Pulse 63   Temp 98.9 F (37.2 C) (Oral)   Resp 16   Ht 5\' 3"  (1.6 m)   Wt 54.4 kg (120 lb)   LMP 04/14/2017   SpO2 100%   BMI 21.26 kg/m   Physical Exam  Constitutional: She appears well-developed and well-nourished.  HENT:  Head: Normocephalic and atraumatic.  Eyes: Conjunctivae and EOM are normal. Right eye exhibits no discharge. Left eye exhibits no discharge. No scleral icterus.  Cardiovascular:  Pulses:      Radial pulses are 2+ on the right side,  and 2+ on the left side.  Pulmonary/Chest: No respiratory distress.  Musculoskeletal: She exhibits no deformity.  Neurological: She is alert.  Skin: Skin is warm and dry. Capillary refill takes less than 2 seconds.  Moderately-sized fluctuant mass noted to the superior medial aspect of the right areola with mild surrounding erythema. Left breast normal.   Psychiatric: She has a normal mood and affect. Her speech is normal and behavior is normal.  Nursing note and vitals reviewed.    ED Treatments / Results  DIAGNOSTIC STUDIES: Oxygen Saturation is 100% on RA, normal  by my interpretation.    COORDINATION OF CARE: 4:17 PM Discussed treatment plan with pt at bedside and pt agreed to plan, which includes a US.   Labs (all labs ordered are listed, but only abnormal results are displayed) Labs Reviewed - No data to display  EKG  EKG Interpretation None       Radiology No results found.  Procedures .Marland Kitchen.Incision and Drainage Date/Time: 04/28/2017 5:33 PM Performed by: Graciella FreerLAYDEN, Erilyn Pearman A Authorized by: Bary CastillaMACKUEN, COURTENEY LYN   Consent:    Consent obtained:  Verbal   Consent given by:  Patient   Risks discussed:  Bleeding, pain, infection and incomplete drainage Location:    Type:  Abscess   Location: right breast. Pre-procedure details:    Skin preparation:  Betadine Anesthesia (see MAR for exact dosages):    Anesthesia method:  Local infiltration   Local anesthetic:  Lidocaine 2% w/o epi Procedure details:    Needle aspiration: no     Incision types:  Single straight   Scalpel blade:  11   Wound management:  Probed and deloculated   Drainage:  Purulent   Drainage amount:  Copious   Wound treatment:  Wound left open   Packing materials:  None Post-procedure details:    Patient tolerance of procedure:  Tolerated well, no immediate complications    (including critical care time)  Medications Ordered in ED Medications  lidocaine (XYLOCAINE) 2 % (with pres) injection 200 mg (200 mg Intradermal Given 04/28/17 1653)  HYDROcodone-acetaminophen (NORCO/VICODIN) 5-325 MG per tablet 1 tablet (1 tablet Oral Given 04/28/17 1712)     Initial Impression / Assessment and Plan / ED Course  I have reviewed the triage vital signs and the nursing notes.  Pertinent labs & imaging results that were available during my care of the patient were reviewed by me and considered in my medical decision making (see chart for details).      26 year old female who presents with 1 week of pain and redness to the right breast. Physical examination concerning  for abscess. Bedside ultrasound was used to visualize moderately large abscess that is very superficial. Discussed options with patient. Explained that patient can follow up with the breast center to have formal evaluation of abscess or that we could attempt to drain and provide relief of abscess in the department. Patient reports that she wants us to try and I&D in the department. Pain medication given in the department.   I&D as documented above.. Wound care instructions given to patient. Instructed patient to continue using warm compresses to help continue express drainage. Will plan to give patient a short course of pain medication to help with pain. Will also provide patient with a short course of antibiotics. Instructed her to wait and let the wound heal in the next 24-48 hours. If she starts experiencing worsening symptoms, she can take the antibiotics as directed. Patient instructed follow-up with the emergency department  if any worsening symptoms. Strict return precautions discussed. Patient expresses understanding and agreement to plan.  Final Clinical Impressions(s) / ED Diagnoses   Final diagnoses:  Breast abscess    New Prescriptions Discharge Medication List as of 04/28/2017  5:40 PM    START taking these medications   Details  dicloxacillin (DYNAPEN) 500 MG capsule Take 1 capsule (500 mg total) by mouth 4 (four) times daily., Starting Wed 04/28/2017, Until Mon 05/03/2017, Print       I personally performed the services described in this documentation, which was scribed in my presence. The recorded information has been reviewed and is accurate.     Maxwell Caul, PA-C 04/28/17 2337    Abelino Derrick, MD 05/02/17 [phone]

## 2017-04-28 NOTE — ED Notes (Signed)
Gauze dressing applied to right breast wound.

## 2017-04-28 NOTE — ED Triage Notes (Signed)
Pt c/o  'lump " to right breast  X 1 week

## 2017-04-28 NOTE — Discharge Instructions (Signed)
As we discussed, use warm compresses to continue to express fluid. Apply warm compresses every 3 hours. Continue trying to keep the wound opens that more material expresses.  You have been given a prescription for antibiotics. If you start noticing some worsening redness or pain, start the antibiotic. Otherwise, you do not have to take it.   Take pain medication as directed for pain.  Return the emergency Department for any worsening pain, redness, fever, spreading of the symptoms any other worsening or concerning symptoms.  If you do not have a primary care doctor you see regularly, please you the list below. Please call them to arrange for follow-up.    No Primary Care Doctor Call [facility]  [phone] Other agencies that provide inexpensive medical care    [facility]  [phone]    [facility]  [phone]    [facility]  [phone]    [facility]  [phone]    [facility]  [phone]    [facility]  [phone]

## 2017-05-29 ENCOUNTER — Encounter (HOSPITAL_BASED_OUTPATIENT_CLINIC_OR_DEPARTMENT_OTHER): Payer: Self-pay | Admitting: Emergency Medicine

## 2017-05-29 ENCOUNTER — Emergency Department (HOSPITAL_BASED_OUTPATIENT_CLINIC_OR_DEPARTMENT_OTHER)
Admission: EM | Admit: 2017-05-29 | Discharge: 2017-05-29 | Disposition: A | Discharge status: Home / Self Care | Payer: No Typology Code available for payment source | Attending: Emergency Medicine | Admitting: Emergency Medicine

## 2017-05-29 DIAGNOSIS — M7918 Myalgia, other site: Secondary | ICD-10-CM

## 2017-05-29 DIAGNOSIS — M791 Myalgia: Secondary | ICD-10-CM | POA: Diagnosis not present

## 2017-05-29 DIAGNOSIS — J45909 Unspecified asthma, uncomplicated: Secondary | ICD-10-CM | POA: Insufficient documentation

## 2017-05-29 DIAGNOSIS — M549 Dorsalgia, unspecified: Secondary | ICD-10-CM | POA: Diagnosis present

## 2017-05-29 MED ORDER — METHOCARBAMOL 500 MG PO TABS: 1000.0000 mg | ORAL_TABLET | Freq: Four times a day (QID) | ORAL | 0 refills | Status: DC

## 2017-05-29 MED ORDER — NAPROXEN 500 MG PO TABS: 500.0000 mg | ORAL_TABLET | Freq: Two times a day (BID) | ORAL | 0 refills | Status: DC

## 2017-05-29 MED ORDER — NAPROXEN 250 MG PO TABS
500.0000 mg | ORAL_TABLET | Freq: Once | ORAL | Status: AC
  Administered 2017-05-29: 500 mg via ORAL
  Filled 2017-05-29: qty 2
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
## 2017-06-11 ENCOUNTER — Encounter (HOSPITAL_BASED_OUTPATIENT_CLINIC_OR_DEPARTMENT_OTHER): Payer: Self-pay | Admitting: *Deleted

## 2017-06-11 ENCOUNTER — Emergency Department (HOSPITAL_BASED_OUTPATIENT_CLINIC_OR_DEPARTMENT_OTHER)
Admission: EM | Admit: 2017-06-11 | Discharge: 2017-06-11 | Disposition: A | Discharge status: Home / Self Care | Payer: Self-pay | Attending: Emergency Medicine | Admitting: Emergency Medicine

## 2017-06-11 DIAGNOSIS — N611 Abscess of the breast and nipple: Secondary | ICD-10-CM | POA: Insufficient documentation

## 2017-06-11 DIAGNOSIS — J45909 Unspecified asthma, uncomplicated: Secondary | ICD-10-CM | POA: Insufficient documentation

## 2017-06-11 MED ORDER — ACETAMINOPHEN 500 MG PO TABS
1000.0000 mg | ORAL_TABLET | Freq: Once | ORAL | Status: AC
  Administered 2017-06-11: 1000 mg via ORAL
  Filled 2017-06-11: qty 2

## 2017-06-11 MED ORDER — SULFAMETHOXAZOLE-TRIMETHOPRIM 800-160 MG PO TABS: 1.0000 | ORAL_TABLET | Freq: Two times a day (BID) | ORAL | 0 refills | Status: AC

## 2017-06-11 MED FILL — SULFAMETHOXAZOLE/TMP DS TAB: 800-160 | 7 days supply | Qty: 14 | Fill #0

## 2017-06-11 NOTE — ED Notes (Signed)
ED Provider at bedside. 

## 2017-06-11 NOTE — ED Notes (Signed)
Warm compress given

## 2017-06-11 NOTE — ED Provider Notes (Signed)
[facility]   CSN: [id number] Arrival date & time: 06/11/17  1020     History   Chief Complaint Chief Complaint  Patient presents with  . Abscess    HPI Kimberly Golden is a 26 y.o. female.  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 The patient was seen here about a month ago for the same. Had it I&D at bedside. Since then the patient has not done warm compresses or got her antibiotics filled. She is not followed up with the breast center. She states she didn't feel her antibiotics because they're too expensive. She denies fevers or chills. Started having some drainage this morning.   The history is provided by the patient.  Abscess  Location:  Torso Torso abscess location:  R breast Abscess quality: draining, fluctuance, painful, redness and warmth   Red streaking: no   Duration:  1 month Progression:  Worsening Pain details:    Quality:  Dull and sharp   Severity:  Moderate   Duration:  1 month   Timing:  Intermittent   Progression:  Worsening Chronicity:  Recurrent Relieved by:  Nothing Worsened by:  Nothing Ineffective treatments:  None tried Associated symptoms: no fever, no headaches, no nausea and no vomiting     Past Medical History:  Diagnosis Date  . Asthma     There are no active problems to display for this patient.   Past Surgical History:  Procedure Laterality Date  . ANTERIOR CRUCIATE LIGAMENT REPAIR      OB History    No data available       Home Medications    Prior to Admission medications   Medication Sig Start Date End Date Taking? Authorizing Provider  albuterol (PROVENTIL HFA;VENTOLIN HFA) 108 (90 Base) MCG/ACT inhaler Inhale 1-2 puffs into the lungs every 6 (six) hours as needed for wheezing or shortness of breath. 12/06/16  Yes Leaphart, Lynann Beaver, PA-C  methocarbamol (ROBAXIN) 500 MG tablet Take 2 tablets (1,000 mg total) by mouth 4 (four) times daily. 05/29/17   Renne Crigler, PA-C  naproxen (NAPROSYN)  500 MG tablet Take 1 tablet (500 mg total) by mouth 2 (two) times daily. 05/29/17   Renne Crigler, PA-C  sulfamethoxazole-trimethoprim (BACTRIM DS,SEPTRA DS) 800-160 MG tablet Take 1 tablet by mouth 2 (two) times daily. 06/11/17 06/18/17  Melene Plan, DO    Family History No family history on file.  Social History Social History  Substance Use Topics  . Smoking status: Never Smoker  . Smokeless tobacco: Never Used  . Alcohol use Yes     Comment: rarely     Allergies   Flagyl [metronidazole] and Ibuprofen   Review of Systems Review of Systems  Constitutional: Negative for chills and fever.  HENT: Negative for congestion and rhinorrhea.   Eyes: Negative for redness and visual disturbance.  Respiratory: Negative for shortness of breath and wheezing.   Cardiovascular: Negative for chest pain and palpitations.  Gastrointestinal: Negative for nausea and vomiting.  Genitourinary: Negative for dysuria and urgency.  Musculoskeletal: Negative for arthralgias and myalgias.  Skin: Positive for color change and wound. Negative for pallor.  Neurological: Negative for dizziness and headaches.     Physical Exam Updated Vital Signs BP 124/84 (BP Location: Right Arm)   Pulse (!) 55   Temp 98.1 F (36.7 C) (Oral)   Resp 14   Ht 5\' 3"  (1.6 m)   Wt 52.2 kg (115 lb)   LMP 05/17/2017 (Exact Date)  SpO2 100%   BMI 20.37 kg/m   Physical Exam  Constitutional: She is oriented to person, place, and time. She appears well-developed and well-nourished. No distress.  HENT:  Head: Normocephalic and atraumatic.  Eyes: EOM are normal. Pupils are equal, round, and reactive to light.  Neck: Normal range of motion. Neck supple.  Cardiovascular: Normal rate and regular rhythm.  Exam reveals no gallop and no friction rub.   No murmur heard. Pulmonary/Chest: Effort normal. She has no wheezes. She has no rales.    Abdominal: Soft. She exhibits no distension. There is no tenderness.    Musculoskeletal: She exhibits no edema or tenderness.  Neurological: She is alert and oriented to person, place, and time.  Skin: Skin is warm and dry. She is not diaphoretic.  Psychiatric: She has a normal mood and affect. Her behavior is normal.  Nursing note and vitals reviewed.    ED Treatments / Results  Labs (all labs ordered are listed, but only abnormal results are displayed) Labs Reviewed - No data to display  EKG  EKG Interpretation None       Radiology No results found.  Procedures Procedures (including critical care time)  Medications Ordered in ED Medications  acetaminophen (TYLENOL) tablet 1,000 mg (not administered)     Initial Impression / Assessment and Plan / ED Course  I have reviewed the triage vital signs and the nursing notes.  Pertinent labs & imaging results that were available during my care of the patient were reviewed by me and considered in my medical decision making (see chart for details).     26 yo F With a recurrent breast abscess. I stressed the importance that this needs to be evaluated at the breast center. This is been going on for a month and makes it more concerning. Given a prescription for Bactrim and she cannot get the other one filled due to cost. Warm compresses. Follow-up in 48 hours.  11:02 AM:  I have discussed the diagnosis/risks/treatment options with the patient and believe the pt to be eligible for discharge home to follow-up with Breast Center. We also discussed returning to the ED immediately if new or worsening sx occur. We discussed the sx which are most concerning (e.g., sudden worsening pain, fever, inability to tolerate by mouth) that necessitate immediate return. Medications administered to the patient during their visit and any new prescriptions provided to the patient are listed below.  Medications given during this visit Medications  acetaminophen (TYLENOL) tablet 1,000 mg (not administered)     The  patient appears reasonably screen and/or stabilized for discharge and I doubt any other medical condition or other [facility] requiring further screening, evaluation, or treatment in the ED at this time prior to discharge.    Final Clinical Impressions(s) / ED Diagnoses   Final diagnoses:  Breast abscess    New Prescriptions New Prescriptions   SULFAMETHOXAZOLE-TRIMETHOPRIM (BACTRIM DS,SEPTRA DS) 800-160 MG TABLET    Take 1 tablet by mouth 2 (two) times daily.     Melene PlanFloyd, Minta Fair, DO 06/11/17 1102

## 2017-06-11 NOTE — ED Triage Notes (Signed)
Pt reports R breast abscess x6days. States it recurred from 1 mo. Ago (reports drained at that time). Denies fever. Reports abscess opened last night and is draining pus and blood.

## 2017-06-11 NOTE — Discharge Instructions (Signed)
You have an abscess on your breasts. Sometimes breast cancer presents this way. [facility] and have this evaluated for imaging or repeat I&D

## 2017-08-03 ENCOUNTER — Encounter (HOSPITAL_BASED_OUTPATIENT_CLINIC_OR_DEPARTMENT_OTHER): Payer: Self-pay | Admitting: *Deleted

## 2017-08-03 ENCOUNTER — Emergency Department (HOSPITAL_BASED_OUTPATIENT_CLINIC_OR_DEPARTMENT_OTHER)
Admission: EM | Admit: 2017-08-03 | Discharge: 2017-08-03 | Disposition: A | Discharge status: Home / Self Care | Payer: Self-pay | Attending: Emergency Medicine | Admitting: Emergency Medicine

## 2017-08-03 ENCOUNTER — Emergency Department (HOSPITAL_BASED_OUTPATIENT_CLINIC_OR_DEPARTMENT_OTHER): Payer: Self-pay

## 2017-08-03 DIAGNOSIS — J45909 Unspecified asthma, uncomplicated: Secondary | ICD-10-CM | POA: Insufficient documentation

## 2017-08-03 DIAGNOSIS — M25571 Pain in right ankle and joints of right foot: Secondary | ICD-10-CM | POA: Insufficient documentation

## 2017-08-03 DIAGNOSIS — Z79899 Other long term (current) drug therapy: Secondary | ICD-10-CM | POA: Insufficient documentation

## 2017-08-03 MED ORDER — ACETAMINOPHEN 500 MG PO TABS
1000.0000 mg | ORAL_TABLET | Freq: Once | ORAL | Status: AC
  Administered 2017-08-03: 1000 mg via ORAL
  Filled 2017-08-03: qty 2

## 2017-08-03 NOTE — ED Provider Notes (Signed)
[facility]   CSN: [id number] Arrival date & time: 08/03/17  1021     History   Chief Complaint Chief Complaint  Patient presents with  . Ankle Pain    HPI Kimberly Golden is a 26 y.o. female presents to ED for evaluation of sudden onset right ankle pain and swelling onset yesterday. States she rolled her ankle while she was horseplay with her friend. Denies previous h/o of injury or surgery. Denies skin color changes, n/t distally. No h/o gout, IVDU or immunosuppresion.   HPI  Past Medical History:  Diagnosis Date  . Asthma     There are no active problems to display for this patient.   Past Surgical History:  Procedure Laterality Date  . ANTERIOR CRUCIATE LIGAMENT REPAIR      OB History    No data available       Home Medications    Prior to Admission medications   Medication Sig Start Date End Date Taking? Authorizing Provider  albuterol (PROVENTIL HFA;VENTOLIN HFA) 108 (90 Base) MCG/ACT inhaler Inhale 1-2 puffs into the lungs every 6 (six) hours as needed for wheezing or shortness of breath. 12/06/16   Rise Mu, PA-C  naproxen (NAPROSYN) 500 MG tablet Take 1 tablet (500 mg total) by mouth 2 (two) times daily. 05/29/17   Renne Crigler, PA-C    Family History History reviewed. No pertinent family history.  Social History Social History  Substance Use Topics  . Smoking status: Never Smoker  . Smokeless tobacco: Never Used  . Alcohol use Yes     Comment: rarely     Allergies   Flagyl [metronidazole] and Ibuprofen   Review of Systems Review of Systems  Constitutional: Negative for chills, diaphoresis and fever.  Musculoskeletal: Positive for arthralgias, gait problem and joint swelling. Negative for myalgias.  Skin: Negative for color change and wound.  Neurological: Negative for numbness.     Physical Exam Updated Vital Signs BP 123/89 (BP Location: Left Arm)   Pulse 84   Temp 98.5 F (36.9 C) (Oral)    Resp 16   Ht 5\' 3"  (1.6 m)   Wt 54.4 kg (120 lb)   LMP 07/31/2017   SpO2 100%   BMI 21.26 kg/m   Physical Exam  Constitutional: She is oriented to person, place, and time. She appears well-developed and well-nourished. No distress.  NAD.  HENT:  Head: Normocephalic and atraumatic.  Right Ear: External ear normal.  Left Ear: External ear normal.  Nose: Nose normal.  Eyes: Conjunctivae and EOM are normal. No scleral icterus.  Neck: Normal range of motion. Neck supple.  Cardiovascular: Normal rate, regular rhythm and normal heart sounds.   No murmur heard. Pulmonary/Chest: Effort normal and breath sounds normal. She has no wheezes.  Musculoskeletal: Normal range of motion. She exhibits edema and tenderness. She exhibits no deformity.  Mild edema and tenderness to below lateral malleoli without overlaying erythema, fluctuance or skin abrasions  Full active ROM of ankles with mild pain only  Patient able to bear weight in ED (4+ steps).   No bony tenderness over posterior aspect of lateral and medial malleoli, navicular bone or 5th metatarsal base.   Achilles tendon is non tender. Negative anterior and posterior drawer.  Positive Talar tilt. Negative syndesmosis squeeze test. Negative Thompson test.   Neurological: She is alert and oriented to person, place, and time.  Skin: Skin is warm and dry. Capillary refill takes less than 2 seconds.  Psychiatric:  She has a normal mood and affect. Her behavior is normal. Judgment and thought content normal.  Nursing note and vitals reviewed.    ED Treatments / Results  Labs (all labs ordered are listed, but only abnormal results are displayed) Labs Reviewed - No data to display  EKG  EKG Interpretation None       Radiology Dg Ankle Complete Right  Result Date: 08/03/2017 CLINICAL DATA:  Lateral ankle pain and swelling. Twisted ankle yesterday. EXAM: RIGHT ANKLE - COMPLETE 3+ VIEW COMPARISON:  None. FINDINGS: The ankle mortise is  maintained. No acute ankle fracture or osteochondral abnormality. The mid and hindfoot bony structures appear intact. IMPRESSION: No acute ankle fracture. Electronically Signed   By: Rudie Meyer M.D.   On: 08/03/2017 11:36    Procedures Procedures (including critical care time)  Medications Ordered in ED Medications  acetaminophen (TYLENOL) tablet 1,000 mg (1,000 mg Oral Given 08/03/17 1146)     Initial Impression / Assessment and Plan / ED Course  I have reviewed the triage vital signs and the nursing notes.  Pertinent labs & imaging results that were available during my care of the patient were reviewed by me and considered in my medical decision making (see chart for details).     26 y.o. year old female presents to ED for Right ankle pain and swelling after rolling her ankle yesterday.  No fevers or chills. No h/o IVDU or immunosuppression. No h/o DVT/PE. Not a prosthetic joint. No preceding trauma, URI or GI illness. On exam, there is mild tenderness and edema below lateral malleolus. No warmth, fluctuance or evidence of overlaying cellulitis. Only mild pain with full ROM. Denies trauma.  Initial differential diagnosis includes septic arthritis, reactive arthritis, gout, however higher suspicion for OA or soft tissue injury. No h/o high risk sexual practices, generalized rash or GU symptoms to raise suspicion for gonococcal arthritis. Will ice, elevate, treat pain and get x-ray.   Final Clinical Impressions(s) / ED Diagnoses   X-ray negative for acute pathology. Likely soft tissue injury. We'll discharge with NSAIDs, rest, ice. Follow-up with PCP for persistent symptoms.   Final diagnoses:  Acute right ankle pain    New Prescriptions New Prescriptions   No medications on file     Jerrell Mylar 08/03/17 1146    Charlynne Pander, MD 08/03/17 [phone]

## 2017-08-03 NOTE — ED Triage Notes (Signed)
Pt reports rolling her ankle while horseplaying yesterday with a friend, denies fall or any other c/o.

## 2017-08-03 NOTE — ED Notes (Signed)
Lrg ice pack given to PT

## 2017-08-03 NOTE — ED Notes (Signed)
Pt verbalized understanding of discharge instructions and denies any further questions at this time.   

## 2017-08-03 NOTE — Discharge Instructions (Signed)
You came to the ED for evaluation of right ankle pain and swelling. Your x-ray did not show any dislocations, fractures or fluid. I suspect she may have inflamed soft tissues around her ankle. Please take ibuprofen and Tylenol for pain. Elevate and ice your joint to decrease inflammation. Follow up with your primary care provider for persistent symptoms. See printout for primary care providers in the area.

## 2017-08-03 NOTE — ED Notes (Signed)
Patient transported to X-ray 

## 2017-08-11 ENCOUNTER — Encounter (HOSPITAL_BASED_OUTPATIENT_CLINIC_OR_DEPARTMENT_OTHER): Payer: Self-pay | Admitting: Emergency Medicine

## 2017-08-11 ENCOUNTER — Encounter (HOSPITAL_BASED_OUTPATIENT_CLINIC_OR_DEPARTMENT_OTHER): Payer: Self-pay | Admitting: *Deleted

## 2017-08-11 ENCOUNTER — Emergency Department (HOSPITAL_BASED_OUTPATIENT_CLINIC_OR_DEPARTMENT_OTHER)
Admission: EM | Admit: 2017-08-11 | Discharge: 2017-08-12 | Disposition: A | Discharge status: Home / Self Care | Payer: Self-pay | Attending: Emergency Medicine | Admitting: Emergency Medicine

## 2017-08-11 ENCOUNTER — Emergency Department (HOSPITAL_BASED_OUTPATIENT_CLINIC_OR_DEPARTMENT_OTHER)
Admission: EM | Admit: 2017-08-11 | Discharge: 2017-08-11 | Disposition: A | Discharge status: Home / Self Care | Payer: Self-pay | Attending: Emergency Medicine | Admitting: Emergency Medicine

## 2017-08-11 DIAGNOSIS — N611 Abscess of the breast and nipple: Secondary | ICD-10-CM | POA: Insufficient documentation

## 2017-08-11 DIAGNOSIS — J45909 Unspecified asthma, uncomplicated: Secondary | ICD-10-CM | POA: Insufficient documentation

## 2017-08-11 DIAGNOSIS — N644 Mastodynia: Secondary | ICD-10-CM | POA: Insufficient documentation

## 2017-08-11 DIAGNOSIS — Z5321 Procedure and treatment not carried out due to patient leaving prior to being seen by health care provider: Secondary | ICD-10-CM | POA: Insufficient documentation

## 2017-08-11 NOTE — ED Notes (Signed)
Pt requesting private room , pt placed back out in lobby

## 2017-08-11 NOTE — ED Triage Notes (Signed)
Pt having left sided breast pain for three days.  Pt states she has a knot.  History of same to right breast.  Denies nipple discharge.

## 2017-08-11 NOTE — ED Triage Notes (Signed)
Pt c/o abscess to left breast x 3 days

## 2017-08-11 NOTE — ED Notes (Signed)
Pt called for x 3, unable to locate patient

## 2017-08-11 NOTE — ED Notes (Signed)
Pt not in ED WR when called for tx room 

## 2017-08-12 MED ORDER — DOXYCYCLINE HYCLATE 100 MG PO CAPS: 100.0000 mg | ORAL_CAPSULE | Freq: Two times a day (BID) | ORAL | 0 refills | Status: DC

## 2017-08-12 NOTE — ED Provider Notes (Signed)
[facility] Provider Note   CSN: [id number] Arrival date & time: 08/11/17  2318     History   Chief Complaint Chief Complaint  Patient presents with  . Abscess    HPI Kimberly Golden is a 26 y.o. female.  The history is provided by the patient.  Abscess  Abscess location: breast. Abscess quality: induration, painful and redness   Duration:  3 days Progression:  Worsening Pain details:    Quality:  Dull   Timing:  Constant   Progression:  Worsening Chronicity:  New Context: not diabetes   Relieved by:  Nothing Exacerbated by: palpation. Associated symptoms: no fever and no vomiting   pt reports left breast abscess Previous h/o right breast abscess   Past Medical History:  Diagnosis Date  . Asthma     There are no active problems to display for this patient.   Past Surgical History:  Procedure Laterality Date  . ANTERIOR CRUCIATE LIGAMENT REPAIR      OB History    No data available       Home Medications    Prior to Admission medications   Medication Sig Start Date End Date Taking? Authorizing Provider  albuterol (PROVENTIL HFA;VENTOLIN HFA) 108 (90 Base) MCG/ACT inhaler Inhale 1-2 puffs into the lungs every 6 (six) hours as needed for wheezing or shortness of breath. 12/06/16   Rise Mu, PA-C  doxycycline (VIBRAMYCIN) 100 MG capsule Take 1 capsule (100 mg total) by mouth 2 (two) times daily. One po bid x 7 days 08/12/17   Zadie Rhine, MD  naproxen (NAPROSYN) 500 MG tablet Take 1 tablet (500 mg total) by mouth 2 (two) times daily. 05/29/17   Renne Crigler, PA-C    Family History History reviewed. No pertinent family history.  Social History Social History  Substance Use Topics  . Smoking status: Never Smoker  . Smokeless tobacco: Never Used  . Alcohol use Yes     Comment: rarely     Allergies   Flagyl [metronidazole] and Ibuprofen   Review of Systems Review of Systems  Constitutional: Negative for fever.    Gastrointestinal: Negative for vomiting.     Physical Exam Updated Vital Signs BP (!) 117/56   Pulse 89   Temp 98.3 F (36.8 C)   Resp 18   Ht 1.6 m ( )   Wt 52.2 kg (115 lb)   LMP 07/31/2017   SpO2 99%   BMI 20.37 kg/m   Physical Exam CONSTITUTIONAL: Well developed/well nourished HEAD: Normocephalic/atraumatic EYES: EOMI ENMT: Mucous membranes moist NECK: supple no meningeal signs CV: S1/S2 noted, no murmurs/rubs/gallops noted LUNGS: Lungs are clear to auscultation bilaterally, no apparent distress Breast - nurse Windell Moulding present for exam Tenderness/induration to superior portion of left areola , no fluctuance, no erythema, no discharge, no streaking ABDOMEN: soft, nontender  NEURO: Pt is awake/alert/appropriate, moves all extremitiesx4.  No facial droop.   EXTREMITIES:   full ROM SKIN: warm, color normal PSYCH: no abnormalities of mood noted, alert and oriented to situation   ED Treatments / Results  Labs (all labs ordered are listed, but only abnormal results are displayed) Labs Reviewed - No data to display  EKG  EKG Interpretation None       Radiology No results found.  Procedures Procedures (including critical care time)  Medications Ordered in ED Medications - No data to display   Initial Impression / Assessment and Plan / ED Course  I have reviewed the triage vital signs and  the nursing notes.  Pertinent labs & imaging results that were available during my care of the patient were reviewed by me and considered in my medical decision making (see chart for details).     Pt requesting antibiotics only It is not amenable to I&D Plan for ABX for one week Will need followup and if does not resolve may need US imaging or mammogram Pt understands this and referred to PCP  Final Clinical Impressions(s) / ED Diagnoses   Final diagnoses:  Breast abscess    New Prescriptions Discharge Medication List as of 08/12/2017 12:30 AM    START taking  these medications   Details  doxycycline (VIBRAMYCIN) 100 MG capsule Take 1 capsule (100 mg total) by mouth 2 (two) times daily. One po bid x 7 days, Starting Thu 08/12/2017, Print         Zadie RhineWickline, Kirstein Baxley, MD 08/12/17 [phone]

## 2017-11-24 ENCOUNTER — Emergency Department (HOSPITAL_BASED_OUTPATIENT_CLINIC_OR_DEPARTMENT_OTHER)
Admission: EM | Admit: 2017-11-24 | Discharge: 2017-11-24 | Disposition: A | Discharge status: Home / Self Care | Payer: Self-pay | Attending: Emergency Medicine | Admitting: Emergency Medicine

## 2017-11-24 ENCOUNTER — Encounter (HOSPITAL_BASED_OUTPATIENT_CLINIC_OR_DEPARTMENT_OTHER): Payer: Self-pay | Admitting: *Deleted

## 2017-11-24 ENCOUNTER — Other Ambulatory Visit: Payer: Self-pay

## 2017-11-24 DIAGNOSIS — N611 Abscess of the breast and nipple: Secondary | ICD-10-CM | POA: Insufficient documentation

## 2017-11-24 DIAGNOSIS — Z79899 Other long term (current) drug therapy: Secondary | ICD-10-CM | POA: Insufficient documentation

## 2017-11-24 DIAGNOSIS — J45909 Unspecified asthma, uncomplicated: Secondary | ICD-10-CM | POA: Insufficient documentation

## 2017-11-24 MED ORDER — HYDROCODONE-ACETAMINOPHEN 5-325 MG PO TABS: 1.0000 | ORAL_TABLET | Freq: Four times a day (QID) | ORAL | 0 refills | Status: DC | PRN

## 2017-11-24 MED ORDER — CLINDAMYCIN PHOSPHATE 600 MG/50ML IV SOLN
600.0000 mg | Freq: Once | INTRAVENOUS | Status: AC
  Administered 2017-11-24: 600 mg via INTRAVENOUS
  Filled 2017-11-24: qty 50

## 2017-11-24 MED ORDER — DOXYCYCLINE HYCLATE 100 MG PO CAPS: 100.0000 mg | ORAL_CAPSULE | Freq: Two times a day (BID) | ORAL | 0 refills | Status: DC

## 2017-11-24 NOTE — ED Notes (Signed)
ED Provider at bedside. 

## 2017-11-24 NOTE — ED Notes (Signed)
Patient's left breast is red and swollen.  She stated that she had the same problem with her right breast and was drained by EDP last July 2018.  She stated that the left breast hurts worst than the right breast when it was swollen.

## 2017-11-24 NOTE — Care Management Note (Signed)
Case Management Note  CM consulted for pcp and no ins with need for urgent breast center follow up s/p abscess that needs US guided drainage.  CM was not able to get appointments at the pt care center or the [facility] at this time.  Will [facility] CM Erskine SquibbJane to follow up with pt when an appointment becomes available.  Updated Ward, PA. No further CM needs noted at this time.

## 2017-11-24 NOTE — Discharge Instructions (Signed)
It was my pleasure taking care of you today!   Please take all of your antibiotics until finished!  [facility] Friday 12/21 at 10:00 am for re-check and referral to the breast center.   Keep your phone near by. I have left a message with our OBGYN clinic in hopes of getting you an appointment tomorrow instead of Friday. They may try to call to inform you of an appointment.   If you develop fever or symptoms worsen, please go straight [facility] or [facility]. You should also return to ER for new symptoms, any additional concerns.

## 2017-11-24 NOTE — ED Provider Notes (Signed)
[facility] EMERGENCY DEPARTMENT Provider Note   CSN: [id number] Arrival date & time: 11/24/17  1357     History   Chief Complaint Chief Complaint  Patient presents with  . Abscess    HPI Kimberly Golden is a 26 y.o. female.  The history is provided by the patient and medical records. No language interpreter was used.  Abscess  Associated symptoms: no fever    Kimberly Golden is a 26 y.o. female  with a PMH of asthma, prior breast abscess who presents to the Emergency Department complaining of abscess to the left breast which began about a week ago. She had a few days worth of Bactrim left over from previous infection, so started taking this BID for the last 3-4 days. Despite this, area has become more painful. She notes history of similar to the right breast in the past which was drained in ED. She reports 2-3 more subsequent visits due to this same breast abscess. After 2-3 rounds of antibiotics with warm compresses, patient notes improvement. She was told to follow up with breast center multiple times, but was unable to do so. She tried to call, but was never able to make an appointment, unsure of why. Denies fever, chills. No drainage from the site.   Past Medical History:  Diagnosis Date  . Asthma     There are no active problems to display for this patient.   Past Surgical History:  Procedure Laterality Date  . ANTERIOR CRUCIATE LIGAMENT REPAIR      OB History    No data available       Home Medications    Prior to Admission medications   Medication Sig Start Date End Date Taking? Authorizing Provider  albuterol (PROVENTIL HFA;VENTOLIN HFA) 108 (90 Base) MCG/ACT inhaler Inhale 1-2 puffs into the lungs every 6 (six) hours as needed for wheezing or shortness of breath. 12/06/16   Rise MuLeaphart, Kenneth T, PA-C  naproxen (NAPROSYN) 500 MG tablet Take 1 tablet (500 mg total) by mouth 2 (two) times daily. 05/29/17   Renne CriglerGeiple, Joshua, PA-C    Family  History History reviewed. No pertinent family history.  Social History Social History   Tobacco Use  . Smoking status: Never Smoker  . Smokeless tobacco: Never Used  Substance Use Topics  . Alcohol use: Yes    Comment: rarely  . Drug use: No    Comment: denies current use     Allergies   Flagyl [metronidazole] and Ibuprofen   Review of Systems Review of Systems  Constitutional: Negative for chills and fever.  HENT: Negative for congestion.   Respiratory: Negative for cough and shortness of breath.   Cardiovascular: Negative for chest pain.  Musculoskeletal: Positive for myalgias.  Skin: Positive for color change and wound.     Physical Exam Updated Vital Signs BP 106/88   Pulse 86   Temp 99.1 F (37.3 C)   Resp 16   Ht 5\' 3"  (1.6 m)   Wt 54.4 kg (120 lb)   LMP 11/01/2017   SpO2 100%   BMI 21.26 kg/m   Physical Exam  Constitutional: She is oriented to person, place, and time. She appears well-developed and well-nourished. No distress.  HENT:  Head: Normocephalic and atraumatic.  Cardiovascular: Normal rate, regular rhythm and normal heart sounds.  No murmur heard. Pulmonary/Chest: Effort normal and breath sounds normal. No respiratory distress.  See image below.     Abdominal: Soft. She exhibits no distension. There is no  tenderness.  Neurological: She is alert and oriented to person, place, and time.  Skin: Skin is warm and dry.  Nursing note and vitals reviewed.      ED Treatments / Results  Labs (all labs ordered are listed, but only abnormal results are displayed) Labs Reviewed - No data to display  EKG  EKG Interpretation None       Radiology No results found.  Procedures Procedures (including critical care time)  Medications Ordered in ED Medications  clindamycin (CLEOCIN) IVPB 600 mg (0 mg Intravenous Stopped 11/24/17 1659)     Initial Impression / Assessment and Plan / ED Course  I have reviewed the triage vital signs  and the nursing notes.  Pertinent labs & imaging results that were available during my care of the patient were reviewed by me and considered in my medical decision making (see chart for details).    Kimberly PavlovSiera L Deller is a 26 y.o. female who presents to ED for abscess to the left breast. On exam, patient is afebrile with erythema and swelling to superior areola which is quite tender to the touch. She has two smaller areas of fluctuance concerning for abscess as well to this area of the breast. She has hx of right breast abscess in the past with multiple subsequent visits and rounds of antibiotics for healing. What this patient ultimately needs is to see breast center. I called the breast center and unfortunately, because patient does not have a primary care doctor or OBGYN who can follow up on her care, they are unable to see patient. I spoke with [place] [facility], [facility] all of which were unable to see patient in a timely fashion for close PCP follow up. I then consulted general surgery who was not able to see her in clinic and stated that she would need to see PCP or breast center. I spoke with case management who has left messages at several clinics in hopes of close follow up care. I recommended patient be admitted given worsening symptoms despite 3 days of Bactrim and breast abscess needing drainage, however patient refuses. I spoke with the patient at length about my concerns that she may continue to worsens without proper treatment and this could lead to a series, life-threatening issue. Patient still refuses admission, but is aware that she may return at any time. Dose of IV ABX given in ED. I finally consulted OBGYN and very much appreciate Dr. Cylinder BlasNewton's assistance. She will send message to clinics to try to arrange for outpatient follow up. I again spoke with patient about plan of care. She understands for fever or worsening symptoms she should  return to ER immediately. She also is aware she may return to ER at any time. Discussed option of returning to ER tomorrow for wound check as well. Per Dr. Alvester MorinNewton of OBGYN's recommendations, will switch to doxycyline. All questions answered.    Final Clinical Impressions(s) / ED Diagnoses   Final diagnoses:  Left breast abscess    ED Discharge Orders    None       Peyton Rossner, Chase PicketJaime Pilcher, PA-C 11/24/17 1908    Clarene DukeLittle, Ambrose Finlandachel Morgan, MD 11/24/17 [phone]

## 2017-11-24 NOTE — ED Triage Notes (Signed)
Pt c/o abscess to right breast x 1 week

## 2017-11-25 MED FILL — DOXYCYCLINE HYCLATE 100 MG: 100 | 10 days supply | Qty: 20 | Fill #0

## 2017-11-25 MED FILL — HYDROCODON-APAP 5-325: 5-325 | 2 days supply | Qty: 5 | Fill #0

## 2017-11-26 ENCOUNTER — Inpatient Hospital Stay: Payer: Self-pay | Admitting: Family Medicine

## 2017-12-27 ENCOUNTER — Ambulatory Visit: Payer: Self-pay | Admitting: Nurse Practitioner

## 2017-12-27 ENCOUNTER — Other Ambulatory Visit: Payer: Self-pay

## 2017-12-27 ENCOUNTER — Emergency Department (HOSPITAL_BASED_OUTPATIENT_CLINIC_OR_DEPARTMENT_OTHER)
Admission: EM | Admit: 2017-12-27 | Discharge: 2017-12-28 | Disposition: A | Discharge status: Home / Self Care | Payer: No Typology Code available for payment source | Attending: Emergency Medicine | Admitting: Emergency Medicine

## 2017-12-27 ENCOUNTER — Encounter (HOSPITAL_BASED_OUTPATIENT_CLINIC_OR_DEPARTMENT_OTHER): Payer: Self-pay | Admitting: *Deleted

## 2017-12-27 DIAGNOSIS — J45909 Unspecified asthma, uncomplicated: Secondary | ICD-10-CM | POA: Insufficient documentation

## 2017-12-27 DIAGNOSIS — R51 Headache: Secondary | ICD-10-CM | POA: Diagnosis present

## 2017-12-27 DIAGNOSIS — Z79899 Other long term (current) drug therapy: Secondary | ICD-10-CM | POA: Insufficient documentation

## 2017-12-27 DIAGNOSIS — M791 Myalgia, unspecified site: Secondary | ICD-10-CM | POA: Insufficient documentation

## 2017-12-27 DIAGNOSIS — Y9389 Activity, other specified: Secondary | ICD-10-CM | POA: Insufficient documentation

## 2017-12-27 DIAGNOSIS — Y92410 Unspecified street and highway as the place of occurrence of the external cause: Secondary | ICD-10-CM | POA: Diagnosis not present

## 2017-12-27 DIAGNOSIS — Y999 Unspecified external cause status: Secondary | ICD-10-CM | POA: Diagnosis not present

## 2017-12-27 DIAGNOSIS — R519 Headache, unspecified: Secondary | ICD-10-CM

## 2017-12-27 MED ORDER — ACETAMINOPHEN 325 MG PO TABS
650.0000 mg | ORAL_TABLET | Freq: Once | ORAL | Status: AC
  Administered 2017-12-27: 650 mg via ORAL
  Filled 2017-12-27: qty 2

## 2017-12-27 MED ORDER — KETOROLAC TROMETHAMINE 30 MG/ML IJ SOLN
30.0000 mg | Freq: Once | INTRAMUSCULAR | Status: AC
  Administered 2017-12-27: 30 mg via INTRAMUSCULAR
  Filled 2017-12-27: qty 1

## 2017-12-27 MED ORDER — CYCLOBENZAPRINE HCL 5 MG PO TABS
5.0000 mg | ORAL_TABLET | Freq: Once | ORAL | Status: AC
  Administered 2017-12-27: 5 mg via ORAL
  Filled 2017-12-27: qty 1
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
## 2017-12-27 NOTE — ED Notes (Signed)
ED Provider at bedside. 

## 2017-12-28 MED ORDER — CYCLOBENZAPRINE HCL 10 MG PO TABS: 10.0000 mg | ORAL_TABLET | Freq: Two times a day (BID) | ORAL | 0 refills | Status: DC | PRN
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
## 2018-01-19 ENCOUNTER — Encounter: Payer: Self-pay | Admitting: Family Medicine

## 2018-01-19 ENCOUNTER — Ambulatory Visit (INDEPENDENT_AMBULATORY_CARE_PROVIDER_SITE_OTHER): Payer: Self-pay | Admitting: Family Medicine

## 2018-01-19 VITALS — BP 118/76 | HR 67 | Temp 98.6°F | Resp 16 | Ht 64.0 in | Wt 125.0 lb

## 2018-01-19 DIAGNOSIS — Z202 Contact with and (suspected) exposure to infections with a predominantly sexual mode of transmission: Secondary | ICD-10-CM

## 2018-01-19 DIAGNOSIS — N644 Mastodynia: Secondary | ICD-10-CM

## 2018-01-19 DIAGNOSIS — J309 Allergic rhinitis, unspecified: Secondary | ICD-10-CM

## 2018-01-19 DIAGNOSIS — Z803 Family history of malignant neoplasm of breast: Secondary | ICD-10-CM

## 2018-01-19 DIAGNOSIS — N611 Abscess of the breast and nipple: Secondary | ICD-10-CM

## 2018-01-19 DIAGNOSIS — Z114 Encounter for screening for human immunodeficiency virus [HIV]: Secondary | ICD-10-CM

## 2018-01-19 DIAGNOSIS — Z23 Encounter for immunization: Secondary | ICD-10-CM

## 2018-01-19 MED ORDER — CETIRIZINE HCL 10 MG PO TABS
10.0000 mg | ORAL_TABLET | Freq: Every day | ORAL | 11 refills | Status: DC
Start: 2018-01-19 — End: 2018-12-13

## 2018-01-19 MED ORDER — DOXYCYCLINE HYCLATE 100 MG PO TABS: 100.0000 mg | ORAL_TABLET | Freq: Two times a day (BID) | ORAL | 0 refills | Status: DC

## 2018-01-19 NOTE — Patient Instructions (Addendum)
For bilateral breast abscess, will restart doxycycline 100 mg twice daily for 10 days.  Also recommend warm, moist compresses daily as needed.  I [facility].  Please call to schedule an appointment. We will follow-up by phone with any abnormal laboratory results.  For allergic rhinitis or seasonal allergies, will send in cetirizine 10 mg. Refrain from smoking. Follow a balanced diet divided over 5-6 small meals throughout the day.  It is important that you add water and increase your fruits and vegetables.   He will return in 1 month for a routine Pap smear.

## 2018-01-19 NOTE — Progress Notes (Signed)
Subjective:    Patient ID: Kimberly Golden, female    DOB: [birth year], 27 y.o.   MRN: [id number]  HPI Kimberly Golden, a 27 year old female with a history of bilateral breast abscess presents to establish care.  Patient states that she has not had a primary provider and has primarily been using the emergency department for all medical needs.  Patient is complaining of bilateral breast abscesses.  She states that abscesses have been occurring frequently over the past year.  Patient characterizes abscesses as tender to touch.  She also says that they drained periodically.  She typically keeps Band-Aids on her breast to prevent draining through clothing.  Patient has a family history of breast cancer.  She currently denies nipple discharge.  Kimberly Golden also has normal menstrual cycles.  She denies fever, chest pains, heart palpitations, nausea, vomiting, or diarrhea.  She also denies weight loss.  She is not up-to-date with vaccinations.  Patient has never had a Pap smear and does not perform monthly self breast exams.  Past Medical History:  Diagnosis Date  . Asthma    Social History   Socioeconomic History  . Marital status: Single    Spouse name: Not on file  . Number of children: Not on file  . Years of education: Not on file  . Highest education level: Not on file  Social Needs  . Financial resource strain: Not on file  . Food insecurity - worry: Not on file  . Food insecurity - inability: Not on file  . Transportation needs - medical: Not on file  . Transportation needs - non-medical: Not on file  Occupational History  . Not on file  Tobacco Use  . Smoking status: Never Smoker  . Smokeless tobacco: Never Used  Substance and Sexual Activity  . Alcohol use: No    Frequency: Never    Comment: rarely  . Drug use: No    Comment: denies current use  . Sexual activity: Not Currently    Birth control/protection: None  Other Topics Concern  . Not on file  Social History Narrative  .  Not on file   Review of Systems  Constitutional: Negative.   HENT: Negative.   Eyes: Negative.   Respiratory: Negative.   Cardiovascular: Negative.   Gastrointestinal: Negative.   Endocrine: Negative for polydipsia, polyphagia and polyuria.  Musculoskeletal: Negative.   Skin:       Bilateral breast abscess  Hematological: Negative.   Psychiatric/Behavioral: Negative.        Objective:   Physical Exam  Constitutional: She is oriented to person, place, and time.  Eyes: Conjunctivae are normal. Pupils are equal, round, and reactive to light.  Neck: Normal range of motion. Neck supple.  Cardiovascular: Normal rate, regular rhythm, normal heart sounds and intact distal pulses.  Pulmonary/Chest: Effort normal and breath sounds normal. Right breast exhibits skin change and tenderness. Right breast exhibits no inverted nipple and no nipple discharge. Left breast exhibits skin change and tenderness. Left breast exhibits no inverted nipple and no nipple discharge. Breasts are symmetrical.    Hyperpigmented areas along breast, and cracking to the nipples bilaterally.  Skin appears dry.  Abdominal: Soft. Bowel sounds are normal.  Musculoskeletal: Normal range of motion.  Neurological: She is alert and oriented to person, place, and time. She has normal reflexes.  Skin: Skin is warm and dry.  Psychiatric: She has a normal mood and affect. Her behavior is normal. Judgment and thought content normal.  BP 118/76 (BP Location: Right Arm, Patient Position: Sitting, Cuff Size: Normal)   Pulse 67   Temp 98.6 F (37 C) (Oral)   Resp 16   Ht 5\' 4"  (1.626 m)   Wt 125 lb (56.7 kg)   LMP 12/27/2017   SpO2 100%   BMI 21.46 kg/m  Assessment & Plan:  1. Abscess of right breast Patient has recurrent abscesses to breasts.  She also has skin changes including hyperpigmentation and cracking of nipples.  She denies nipple discharge.  However, with the breasts appearance and changes she warrants a  diagnostic mammogram.  She also has a family history of breast cancer and paternal grandmother.  Will start a trial of doxycycline 100 mg twice daily for 10 days. - doxycycline (VIBRA-TABS) 100 MG tablet; Take 1 tablet (100 mg total) by mouth 2 (two) times daily for 10 days.  Dispense: 20 tablet; Refill: 0  2. Left breast abscess - doxycycline (VIBRA-TABS) 100 MG tablet; Take 1 tablet (100 mg total) by mouth 2 (two) times daily for 10 days.  Dispense: 20 tablet; Refill: 0  3. Influenza vaccination given - Flu Vaccine QUAD 36+ mos IM (Fluarix & Fluzone Quad PF  4. Screening for HIV (human immunodeficiency virus) - HIV antibody (with reflex)  5. Possible exposure to STD - RPR - HIV antibody (with reflex) - GC/Chlamydia Probe Amp([facility])  6. Family history of breast cancer Patient has not discussed family history of breast cancer at length  7. Pain of both breasts - MM Digital Diagnostic Bilat; Future - doxycycline (VIBRA-TABS) 100 MG tablet; Take 1 tablet (100 mg total) by mouth 2 (two) times daily for 10 days.  Dispense: 20 tablet; Refill: 0  8. Allergic rhinitis, unspecified seasonality, unspecified trigger - cetirizine (ZYRTEC) 10 MG tablet; Take 1 tablet (10 mg total) by mouth daily.  Dispense: 30 tablet; Refill: 11   RTC: One month for Pap smear   Nolon NationsLachina Moore Jakeya Gherardi  MSN, FNP-C Patient Care [facility] [address]  [place], [place] [id number] [phone]

## 2018-01-20 LAB — HIV ANTIBODY (ROUTINE TESTING W REFLEX): HIV Screen 4th Generation wRfx: NONREACTIVE

## 2018-01-20 LAB — GC/CHLAMYDIA PROBE AMP
Chlamydia trachomatis, NAA: NEGATIVE
Neisseria gonorrhoeae by PCR: NEGATIVE

## 2018-01-20 LAB — RPR: RPR: NONREACTIVE

## 2018-01-21 ENCOUNTER — Telehealth: Payer: Self-pay

## 2018-01-21 NOTE — Telephone Encounter (Signed)
-----   Message from Massie MaroonLachina M Hollis, [place]NP sent at 01/20/2018  4:44 PM EST ----- Regarding: lab results Please inform patient that all labs were negative. Follow up in office in 1 month for pap smear.   Thanks

## 2018-01-21 NOTE — Telephone Encounter (Signed)
Called and spoke with patient, advised to keep follow up in 1 month and that all labs were negative. Patient verbalized understanding. Thanks!

## 2018-01-25 ENCOUNTER — Other Ambulatory Visit: Payer: Self-pay

## 2018-01-25 DIAGNOSIS — Z803 Family history of malignant neoplasm of breast: Secondary | ICD-10-CM

## 2018-01-25 DIAGNOSIS — N611 Abscess of the breast and nipple: Secondary | ICD-10-CM

## 2018-01-25 DIAGNOSIS — N644 Mastodynia: Secondary | ICD-10-CM

## 2018-01-25 MED ORDER — DOXYCYCLINE HYCLATE 100 MG PO TABS: 100.0000 mg | ORAL_TABLET | Freq: Two times a day (BID) | ORAL | 0 refills | Status: AC

## 2018-01-27 ENCOUNTER — Ambulatory Visit: Payer: Self-pay | Admitting: Family Medicine

## 2018-02-12 ENCOUNTER — Other Ambulatory Visit: Payer: Self-pay

## 2018-02-12 ENCOUNTER — Emergency Department (HOSPITAL_BASED_OUTPATIENT_CLINIC_OR_DEPARTMENT_OTHER)
Admission: EM | Admit: 2018-02-12 | Discharge: 2018-02-12 | Disposition: A | Discharge status: Home / Self Care | Payer: Self-pay | Attending: Emergency Medicine | Admitting: Emergency Medicine

## 2018-02-12 ENCOUNTER — Encounter (HOSPITAL_BASED_OUTPATIENT_CLINIC_OR_DEPARTMENT_OTHER): Payer: Self-pay | Admitting: Emergency Medicine

## 2018-02-12 DIAGNOSIS — J45909 Unspecified asthma, uncomplicated: Secondary | ICD-10-CM | POA: Insufficient documentation

## 2018-02-12 DIAGNOSIS — J069 Acute upper respiratory infection, unspecified: Secondary | ICD-10-CM | POA: Insufficient documentation

## 2018-02-12 DIAGNOSIS — B9789 Other viral agents as the cause of diseases classified elsewhere: Secondary | ICD-10-CM

## 2018-02-12 DIAGNOSIS — R0981 Nasal congestion: Secondary | ICD-10-CM | POA: Insufficient documentation

## 2018-02-12 DIAGNOSIS — Z79899 Other long term (current) drug therapy: Secondary | ICD-10-CM | POA: Insufficient documentation

## 2018-02-12 MED ORDER — BENZONATATE 100 MG PO CAPS: 100.0000 mg | ORAL_CAPSULE | Freq: Three times a day (TID) | ORAL | 0 refills | Status: DC

## 2018-02-12 MED ORDER — CETIRIZINE-PSEUDOEPHEDRINE ER 5-120 MG PO TB12: 1.0000 | ORAL_TABLET | Freq: Two times a day (BID) | ORAL | 0 refills | Status: DC

## 2018-02-12 NOTE — ED Triage Notes (Signed)
Cough since Monday. Denies fever, sore throat.

## 2018-02-12 NOTE — Discharge Instructions (Signed)
Take Tessalon every 8 hours as needed for cough.  Take Zyrtec-D twice daily for nasal congestion.  Please follow-up with your doctor if your symptoms are persisting.  Return to emergency department or see her doctor immediately if you develop any new or worsening symptoms including severe chest pain or shortness of breath, persistent fever over 100.4, or any other new or concerning symptoms.

## 2018-02-12 NOTE — ED Provider Notes (Signed)
[facility] EMERGENCY DEPARTMENT Provider Note   CSN: [id number] Arrival date & time: 02/12/18  1200     History   Chief Complaint Chief Complaint  Patient presents with  . Cough    HPI Kimberly Golden is a 27 y.o. female with history of asthma who presents with a 5-day history of cough and nasal congestion.  She has been taking an over-the-counter cough medication with some relief.  She denies any fevers, chest pain, shortness of breath, ear pain, sore throat, abdominal pain, nausea, vomiting, or diarrhea.  She reports her symptoms are worse when she lays down and her nose is congested.  She has not needed to use her inhaler anymore than usual.  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  HPI  Past Medical History:  Diagnosis Date  . Asthma     There are no active problems to display for this patient.   Past Surgical History:  Procedure Laterality Date  . ANTERIOR CRUCIATE LIGAMENT REPAIR      OB History    No data available       Home Medications    Prior to Admission medications   Medication Sig Start Date End Date Taking? Authorizing Provider  benzonatate (TESSALON) 100 MG capsule Take 1 capsule (100 mg total) by mouth every 8 (eight) hours. 02/12/18   Jadie Comas, Waylan BogaAlexandra M, PA-C  cetirizine (ZYRTEC) 10 MG tablet Take 1 tablet (10 mg total) by mouth daily. 01/19/18   Massie MaroonHollis, Lachina M, FNP  cetirizine-pseudoephedrine (ZYRTEC-D) 5-120 MG tablet Take 1 tablet by mouth 2 (two) times daily. 02/12/18   Emi HolesLaw, Hason Ofarrell M, PA-C    Family History Family History  Problem Relation Age of Onset  . Diabetes Father   . Cancer Paternal Grandmother     Social History Social History   Tobacco Use  . Smoking status: Never Smoker  . Smokeless tobacco: Never Used  Substance Use Topics  . Alcohol use: No    Frequency: Never    Comment: rarely  . Drug use: Yes    Types: Marijuana    Comment: denies current use     Allergies   Flagyl  [metronidazole] and Ibuprofen   Review of Systems Review of Systems  Constitutional: Negative for fever.  HENT: Positive for congestion. Negative for ear pain and sore throat.   Respiratory: Positive for cough. Negative for shortness of breath.   Cardiovascular: Negative for chest pain.  Gastrointestinal: Negative for diarrhea and vomiting.     Physical Exam Updated Vital Signs BP 108/78 (BP Location: Left Arm)   Pulse 70   Temp 98.4 F (36.9 C) (Oral)   Resp 16   Ht 5\' 3"  (1.6 m)   Wt 56.7 kg (125 lb)   LMP 02/01/2018   SpO2 100%   BMI 22.14 kg/m   Physical Exam  Constitutional: She appears well-developed and well-nourished. No distress.  HENT:  Head: Normocephalic and atraumatic.  Mouth/Throat: Oropharynx is clear and moist. No oropharyngeal exudate.  Eyes: Conjunctivae are normal. Pupils are equal, round, and reactive to light. Right eye exhibits no discharge. Left eye exhibits no discharge. No scleral icterus.  Neck: Normal range of motion. Neck supple. No thyromegaly present.  Cardiovascular: Normal rate, regular rhythm, normal heart sounds and intact distal pulses. Exam reveals no gallop and no friction rub.  No murmur heard. Pulmonary/Chest: Effort normal and breath sounds normal. No stridor. No respiratory distress. She has no wheezes. She has  no rales.  Abdominal: Soft. Bowel sounds are normal. She exhibits no distension. There is no tenderness. There is no rebound and no guarding.  Musculoskeletal: She exhibits no edema.  Lymphadenopathy:    She has no cervical adenopathy.  Neurological: She is alert. Coordination normal.  Skin: Skin is warm and dry. No rash noted. She is not diaphoretic. No pallor.  Psychiatric: She has a normal mood and affect.  Nursing note and vitals reviewed.    ED Treatments / Results  Labs (all labs ordered are listed, but only abnormal results are displayed) Labs Reviewed - No data to display  EKG  EKG Interpretation None         Radiology No results found.  Procedures Procedures (including critical care time)  Medications Ordered in ED Medications - No data to display   Initial Impression / Assessment and Plan / ED Course  I have reviewed the triage vital signs and the nursing notes.  Pertinent labs & imaging results that were available during my care of the patient were reviewed by me and considered in my medical decision making (see chart for details).     Pt symptoms consistent with URI.  Lungs are clear to auscultation.  Considering duration, I offered chest x-ray, however patient declined.  I feel this is reasonable as lungs are clear and she is afebrile.  Pt will be discharged with symptomatic treatment.  Discussed return precautions.  Patient understands and agrees with plan.  Patient vital stable throughout ED course and discharged in satisfactory condition.   Final Clinical Impressions(s) / ED Diagnoses   Final diagnoses:  Viral URI with cough    ED Discharge Orders        Ordered    cetirizine-pseudoephedrine (ZYRTEC-D) 5-120 MG tablet  2 times daily     02/12/18 1343    benzonatate (TESSALON) 100 MG capsule  Every 8 hours     02/12/18 1343       Raechal Raben, Waylan Boga, PA-C 02/12/18 1400    Vanetta Mulders, MD 02/13/18 [phone]

## 2018-02-16 ENCOUNTER — Ambulatory Visit: Payer: Self-pay | Admitting: Family Medicine

## 2018-02-25 ENCOUNTER — Ambulatory Visit: Payer: Self-pay | Admitting: Family Medicine

## 2018-03-12 ENCOUNTER — Emergency Department (HOSPITAL_BASED_OUTPATIENT_CLINIC_OR_DEPARTMENT_OTHER): Payer: Self-pay

## 2018-03-12 ENCOUNTER — Encounter (HOSPITAL_BASED_OUTPATIENT_CLINIC_OR_DEPARTMENT_OTHER): Payer: Self-pay | Admitting: Emergency Medicine

## 2018-03-12 ENCOUNTER — Other Ambulatory Visit: Payer: Self-pay

## 2018-03-12 ENCOUNTER — Emergency Department (HOSPITAL_BASED_OUTPATIENT_CLINIC_OR_DEPARTMENT_OTHER)
Admission: EM | Admit: 2018-03-12 | Discharge: 2018-03-12 | Disposition: A | Discharge status: Home / Self Care | Payer: Self-pay | Attending: Emergency Medicine | Admitting: Emergency Medicine

## 2018-03-12 DIAGNOSIS — R059 Cough, unspecified: Secondary | ICD-10-CM

## 2018-03-12 DIAGNOSIS — J45909 Unspecified asthma, uncomplicated: Secondary | ICD-10-CM | POA: Insufficient documentation

## 2018-03-12 DIAGNOSIS — R197 Diarrhea, unspecified: Secondary | ICD-10-CM | POA: Insufficient documentation

## 2018-03-12 DIAGNOSIS — R05 Cough: Secondary | ICD-10-CM

## 2018-03-12 DIAGNOSIS — J069 Acute upper respiratory infection, unspecified: Secondary | ICD-10-CM | POA: Insufficient documentation

## 2018-03-12 MED ORDER — ONDANSETRON HCL 4 MG PO TABS: 4.0000 mg | ORAL_TABLET | Freq: Three times a day (TID) | ORAL | 0 refills | Status: DC | PRN

## 2018-03-12 NOTE — ED Provider Notes (Signed)
[facility] EMERGENCY DEPARTMENT Provider Note   CSN: [id number] Arrival date & time: 03/12/18  4098     History   Chief Complaint Chief Complaint  Patient presents with  . Cough  . Otalgia    HPI Kimberly Golden is a 27 y.o. female.  The history is provided by the patient, the spouse and medical records.  URI   This is a new problem. The current episode started more than 2 days ago. The problem has not changed since onset.There has been no fever. Associated symptoms include diarrhea, congestion, ear pain, rhinorrhea, sneezing and cough. Pertinent negatives include no chest pain, no abdominal pain, no vomiting, no dysuria, no headaches, no neck pain, no rash and no wheezing. She has tried nothing for the symptoms.  Diarrhea   Associated symptoms include chills, URI and cough. Pertinent negatives include no abdominal pain, no vomiting and no headaches.    Past Medical History:  Diagnosis Date  . Asthma     There are no active problems to display for this patient.   Past Surgical History:  Procedure Laterality Date  . ANTERIOR CRUCIATE LIGAMENT REPAIR       OB History   None      Home Medications    Prior to Admission medications   Medication Sig Start Date End Date Taking? Authorizing Provider  benzonatate (TESSALON) 100 MG capsule Take 1 capsule (100 mg total) by mouth every 8 (eight) hours. 02/12/18   Law, Waylan Boga, PA-C  cetirizine (ZYRTEC) 10 MG tablet Take 1 tablet (10 mg total) by mouth daily. 01/19/18   Massie Maroon, FNP  cetirizine-pseudoephedrine (ZYRTEC-D) 5-120 MG tablet Take 1 tablet by mouth 2 (two) times daily. 02/12/18   Emi Holes, PA-C    Family History Family History  Problem Relation Age of Onset  . Diabetes Father   . Cancer Paternal Grandmother     Social History Social History   Tobacco Use  . Smoking status: Never Smoker  . Smokeless tobacco: Never Used  Substance Use Topics  . Alcohol use: No    Frequency:  Never    Comment: rarely  . Drug use: Yes    Types: Marijuana    Comment: denies current use     Allergies   Flagyl [metronidazole] and Ibuprofen   Review of Systems Review of Systems  Constitutional: Positive for chills. Negative for diaphoresis, fatigue and fever.  HENT: Positive for congestion, ear pain, rhinorrhea and sneezing.   Eyes: Negative for visual disturbance.  Respiratory: Positive for cough. Negative for chest tightness, shortness of breath and wheezing.   Cardiovascular: Negative for chest pain, palpitations and leg swelling.  Gastrointestinal: Positive for diarrhea. Negative for abdominal pain, constipation and vomiting.  Genitourinary: Negative for dysuria, flank pain and frequency.  Musculoskeletal: Negative for back pain, neck pain and neck stiffness.  Skin: Negative for rash and wound.  Neurological: Negative for light-headedness and headaches.  Psychiatric/Behavioral: Negative for agitation and confusion.  All other systems reviewed and are negative.    Physical Exam Updated Vital Signs BP 120/86 (BP Location: Left Arm)   Pulse 84   Temp 98.2 F (36.8 C) (Oral)   Resp 18   Ht 5\' 3"  (1.6 m)   Wt 56.7 kg (125 lb)   LMP 03/08/2018   SpO2 100%   BMI 22.14 kg/m   Physical Exam  Constitutional: She is oriented to person, place, and time. She appears well-developed and well-nourished. No distress.  HENT:  Head: Normocephalic and atraumatic.  Right Ear: External ear normal.  Left Ear: External ear normal.  Nose: Nose normal.  Mouth/Throat: Oropharynx is clear and moist. No oropharyngeal exudate.  Eyes: Pupils are equal, round, and reactive to light. Conjunctivae and EOM are normal.  Neck: Normal range of motion. Neck supple.  Cardiovascular: Normal rate and intact distal pulses.  No murmur heard. Pulmonary/Chest: Effort normal and breath sounds normal. No respiratory distress. She has no wheezes. She has no rales. She exhibits no tenderness.    Abdominal: Soft. She exhibits no distension. There is no tenderness.  Musculoskeletal: She exhibits no edema or tenderness.  Lymphadenopathy:    She has no cervical adenopathy.  Neurological: She is alert and oriented to person, place, and time.  Skin: Capillary refill takes less than 2 seconds. No rash noted. She is not diaphoretic. No erythema.  Psychiatric: She has a normal mood and affect.  Nursing note and vitals reviewed.    ED Treatments / Results  Labs (all labs ordered are listed, but only abnormal results are displayed) Labs Reviewed - No data to display  EKG None  Radiology Dg Chest 2 View  Result Date: 03/12/2018 CLINICAL DATA:  Cough, nausea, diarrhea and asthma. EXAM: CHEST - 2 VIEW COMPARISON:  06/26/2016 FINDINGS: The heart size and mediastinal contours are within normal limits. There is no evidence of pulmonary edema, consolidation, pneumothorax, nodule or pleural fluid. The visualized skeletal structures are unremarkable. IMPRESSION: Normal chest x-ray. Electronically Signed   By: Irish LackGlenn  Yamagata M.D.   On: 03/12/2018 10:53    Procedures Procedures (including critical care time)  Medications Ordered in ED Medications - No data to display   Initial Impression / Assessment and Plan / ED Course  I have reviewed the triage vital signs and the nursing notes.  Pertinent labs & imaging results that were available during my care of the patient were reviewed by me and considered in my medical decision making (see chart for details).     Kimberly Golden is a 27 y.o. female with a past medical history significant for asthma who presents with rhinorrhea, cough, congestion, chills, nausea, and diarrhea.  Patient reports that she is been feeling bad for the last 3 days.  She denies any fevers at home and denies any vomiting.  She denies any pain aside from the pain in her right ear.  She denies any drainage or dizziness.  She denies any neurologic deficits.  She works at a  [employer] and likely has sick contacts.  She denies any new medications or other changes.  On exam, lungs are clear.  Chest is nontender.  Abdomen nontender.  Ears show no evidence of otitis media bilaterally.  Oropharynx was slightly erythematous but no evidence of PTA or RPA.  Normal neck range of motion.  Abdomen is nontender.  No rashes seen on exam.  Suspect viral infection causing her constellation of symptoms.  As she is on day 3 of symptoms, do not feel it is warranted to check for influenza or give her Tamiflu.  Given her history of asthma with the productive cough with phlegm, patient will have a chest x-ray obtained.  Patient will be p.o. challenged here.  Given the nausea yesterday want to ensure she can stay hydrated at home.  Chest x-ray shows no evidence of pneumonia.  Patient was able to tolerate eating and drinking.  Given the intermittent nausea, patient will be given prescription for Zofran and will follow up with  her PCP.  Patient understood return precautions as well as follow-up instructions.  Patient had no other questions or concerns and was discharged in good condition.   Final Clinical Impressions(s) / ED Diagnoses   Final diagnoses:  Upper respiratory tract infection, unspecified type  Cough  Diarrhea, unspecified type    ED Discharge Orders        Ordered    ondansetron (ZOFRAN) 4 MG tablet  Every 8 hours PRN     03/12/18 1126      Clinical Impression: 1. Upper respiratory tract infection, unspecified type   2. Cough   3. Diarrhea, unspecified type     Disposition: Discharge  Condition: Good  I have discussed the results, Dx and Tx plan with the pt(& family if present). He/she/they expressed understanding and agree(s) with the plan. Discharge instructions discussed at great length. Strict return precautions discussed and pt &/or family have verbalized understanding of the instructions. No further questions at time of discharge.    Discharge  Medication List as of 03/12/2018 11:27 AM    START taking these medications   Details  ondansetron (ZOFRAN) 4 MG tablet Take 1 tablet (4 mg total) by mouth every 8 (eight) hours as needed for nausea or vomiting., Starting Sat 03/12/2018, Print        Follow Up: Massie Maroon, FNP [address] [place] [address] [phone]     [facility] [address] [id number] [address] [place] [address] [phone]       Rukaya Kleinschmidt, Canary Brim, MD 03/12/18 1147

## 2018-03-12 NOTE — ED Triage Notes (Signed)
Patient states that she has had cough and pain to her right ear x 2 -3 days

## 2018-03-12 NOTE — Discharge Instructions (Signed)
Your workup today showed no evidence of pneumonia.  Your ears did not show evidence of infection.  We suspect you have a viral infection that is causing the upper respiratory symptoms and your diarrhea.  Please stay hydrated and use the nausea medicine if needed to maintain hydration.  Please follow-up with your primary doctor.  If any symptoms change or worsen, please return to the nearest emergency department.

## 2018-03-12 NOTE — ED Notes (Signed)
Patient given peanut butter crackers and water

## 2018-08-21 ENCOUNTER — Encounter (HOSPITAL_BASED_OUTPATIENT_CLINIC_OR_DEPARTMENT_OTHER): Payer: Self-pay | Admitting: Emergency Medicine

## 2018-08-21 ENCOUNTER — Other Ambulatory Visit: Payer: Self-pay

## 2018-08-21 ENCOUNTER — Emergency Department (HOSPITAL_BASED_OUTPATIENT_CLINIC_OR_DEPARTMENT_OTHER)
Admission: EM | Admit: 2018-08-21 | Discharge: 2018-08-21 | Disposition: A | Discharge status: Home / Self Care | Payer: Self-pay | Attending: Emergency Medicine | Admitting: Emergency Medicine

## 2018-08-21 DIAGNOSIS — Z79899 Other long term (current) drug therapy: Secondary | ICD-10-CM | POA: Insufficient documentation

## 2018-08-21 DIAGNOSIS — J45909 Unspecified asthma, uncomplicated: Secondary | ICD-10-CM | POA: Insufficient documentation

## 2018-08-21 DIAGNOSIS — B349 Viral infection, unspecified: Secondary | ICD-10-CM | POA: Insufficient documentation

## 2018-08-21 MED ORDER — LOPERAMIDE HCL 2 MG PO TABS: 2.0000 mg | ORAL_TABLET | Freq: Four times a day (QID) | ORAL | 0 refills | Status: DC | PRN

## 2018-08-21 NOTE — ED Notes (Signed)
Pt understood dc material. NAD noted. Script and work excuse given at [employer]

## 2018-08-21 NOTE — ED Triage Notes (Signed)
Pt states that since Friday she has been experiencing a dry cough and headache. She has been having intermittent episodes of diarrhea and ran a temp of 100.8 Friday night that abated with tylenol and thearflu.

## 2018-08-21 NOTE — ED Provider Notes (Signed)
[facility] EMERGENCY DEPARTMENT Provider Note   CSN: [id number] Arrival date & time: 08/21/18  9562     History   Chief Complaint Chief Complaint  Patient presents with  . Cough  . Headache    HPI Kimberly Golden is a 27 y.o. female.  Patient since Friday is experiencing a dry cough some headache some intermittent episodes of diarrhea no greater than 3 a day.  On Friday patient had a temp to 100.8.  Patient treated herself initially with Tylenol and TheraFlu.  Fever has not recurred.  No nausea no vomiting no significant nasal congestion.  Definitely has sick exposure at home with upper respiratory viral type illnesses.  No blood in the bowel movements.  No rash.  No neck stiffness.     Past Medical History:  Diagnosis Date  . Asthma     There are no active problems to display for this patient.   Past Surgical History:  Procedure Laterality Date  . ANTERIOR CRUCIATE LIGAMENT REPAIR       OB History   None      Home Medications    Prior to Admission medications   Medication Sig Start Date End Date Taking? Authorizing Provider  benzonatate (TESSALON) 100 MG capsule Take 1 capsule (100 mg total) by mouth every 8 (eight) hours. 02/12/18   Law, Waylan Boga, PA-C  cetirizine (ZYRTEC) 10 MG tablet Take 1 tablet (10 mg total) by mouth daily. 01/19/18   Massie Maroon, FNP  cetirizine-pseudoephedrine (ZYRTEC-D) 5-120 MG tablet Take 1 tablet by mouth 2 (two) times daily. 02/12/18   Law, Waylan Boga, PA-C  loperamide (IMODIUM A-D) 2 MG tablet Take 1 tablet (2 mg total) by mouth 4 (four) times daily as needed for diarrhea or loose stools. 08/21/18   Vanetta Mulders, MD  ondansetron (ZOFRAN) 4 MG tablet Take 1 tablet (4 mg total) by mouth every 8 (eight) hours as needed for nausea or vomiting. 03/12/18   Tegeler, Canary Brim, MD    Family History Family History  Problem Relation Age of Onset  . Diabetes Father   . Cancer Paternal Grandmother     Social  History Social History   Tobacco Use  . Smoking status: Never Smoker  . Smokeless tobacco: Never Used  Substance Use Topics  . Alcohol use: No    Frequency: Never    Comment: rarely  . Drug use: Yes    Types: Marijuana    Comment: denies current use     Allergies   Flagyl [metronidazole] and Ibuprofen   Review of Systems Review of Systems  Constitutional: Positive for fever.  HENT: Negative for congestion.   Eyes: Negative for redness.  Respiratory: Positive for cough. Negative for shortness of breath.   Cardiovascular: Negative for chest pain.  Gastrointestinal: Positive for diarrhea. Negative for abdominal pain, nausea and vomiting.  Genitourinary: Negative for dysuria.  Musculoskeletal: Negative for back pain and neck stiffness.  Skin: Negative for rash.  Neurological: Positive for headaches.  Hematological: Does not bruise/bleed easily.  Psychiatric/Behavioral: Negative for confusion.     Physical Exam Updated Vital Signs BP 124/83   Pulse 86   Temp 97.9 F (36.6 C) (Oral)   Resp 18   Ht 1.6 m (5\' 3" )   Wt 54.4 kg   SpO2 100%   BMI 21.26 kg/m   Physical Exam  Constitutional: She is oriented to person, place, and time. She appears well-developed and well-nourished. No distress.  HENT:  Head: Normocephalic and  atraumatic.  Mouth/Throat: Oropharynx is clear and moist. No oropharyngeal exudate.  Eyes: Pupils are equal, round, and reactive to light. Conjunctivae and EOM are normal.  Neck: Normal range of motion. Neck supple.  Cardiovascular: Normal rate, regular rhythm and normal heart sounds.  Pulmonary/Chest: Effort normal and breath sounds normal. No respiratory distress. She has no wheezes. She has no rales.  Abdominal: Soft. Bowel sounds are normal. There is no tenderness.  Musculoskeletal: Normal range of motion. She exhibits no edema.  Neurological: She is alert and oriented to person, place, and time. No cranial nerve deficit or sensory deficit. She  exhibits normal muscle tone. Coordination normal.  Skin: Skin is warm. No rash noted.  Nursing note and vitals reviewed.    ED Treatments / Results  Labs (all labs ordered are listed, but only abnormal results are displayed) Labs Reviewed - No data to display  EKG None  Radiology No results found.  Procedures Procedures (including critical care time)  Medications Ordered in ED Medications - No data to display   Initial Impression / Assessment and Plan / ED Course  I have reviewed the triage vital signs and the nursing notes.  Pertinent labs & imaging results that were available during my care of the patient were reviewed by me and considered in my medical decision making (see chart for details).     Patient nontoxic no acute distress.  Lungs are clear bilaterally.  Symptoms consistent with a viral illness may most likely be viral upper respiratory illness with a component of some diarrhea.  We will treat symptomatically for the potential diarrhea.  Cough is dry nonproductive throat clear no significant sore throat.  Patient will return for any new or worse symptoms.  Work note provided.  Final Clinical Impressions(s) / ED Diagnoses   Final diagnoses:  Viral illness    ED Discharge Orders         Ordered    loperamide (IMODIUM A-D) 2 MG tablet  4 times daily PRN     08/21/18 0753           Vanetta MuldersZackowski, Emaley Applin, MD 08/21/18 [phone]

## 2018-08-21 NOTE — Discharge Instructions (Signed)
If the diarrhea increases take the Imodium as directed.  Return for any new or worse symptoms or follow-up with your doctor.  Lungs sound clear today.  No concern currently for pneumonia.  Work note provided.

## 2018-09-07 IMAGING — CR DG ANKLE COMPLETE 3+V*R*
3 series · 3 of 3 positions shown · non-contrast
Comparison: None.

CLINICAL DATA: Lateral ankle pain and swelling. Twisted ankle
yesterday.

EXAM:
RIGHT ANKLE - COMPLETE 3+ VIEW

[t ankle joint ap right]
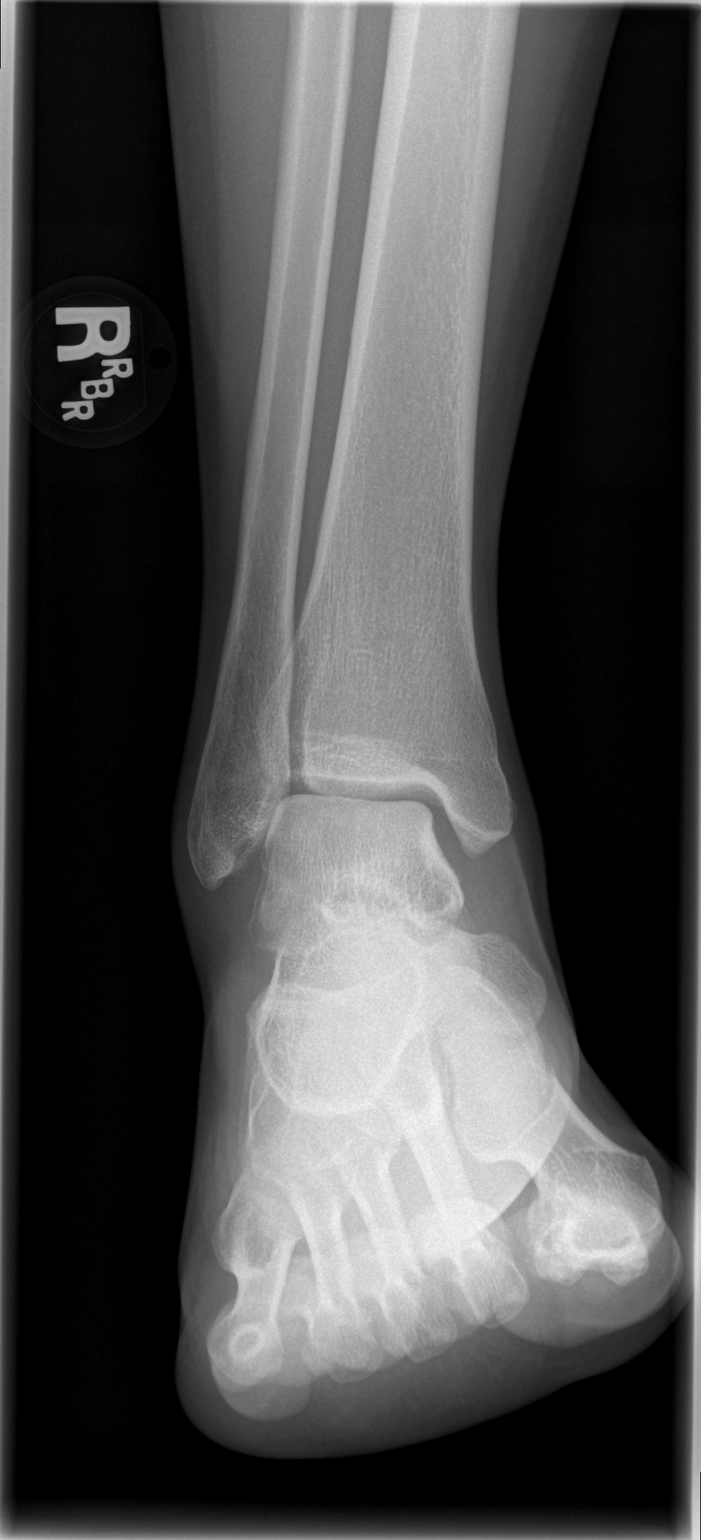

[t ankle joint oblique right]
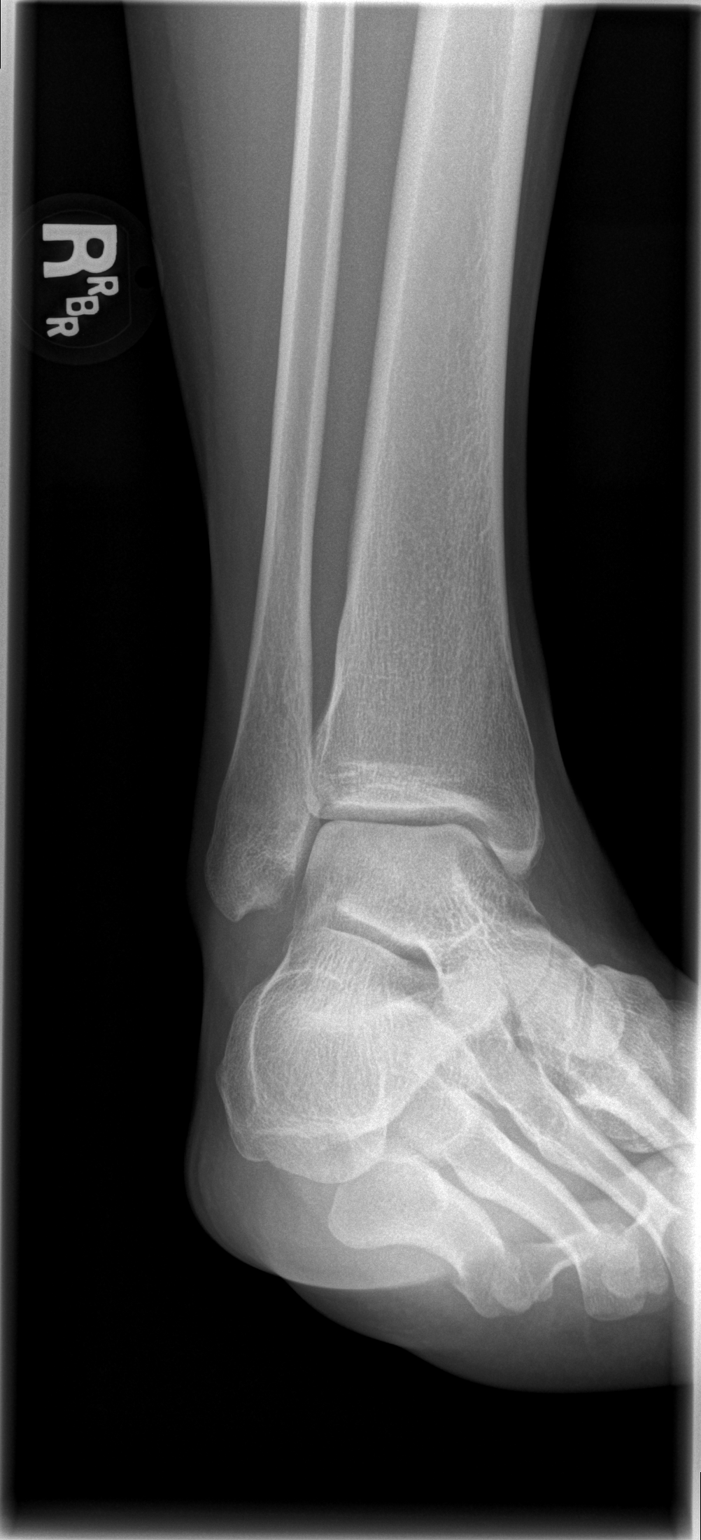

[t ankle joint lat right]
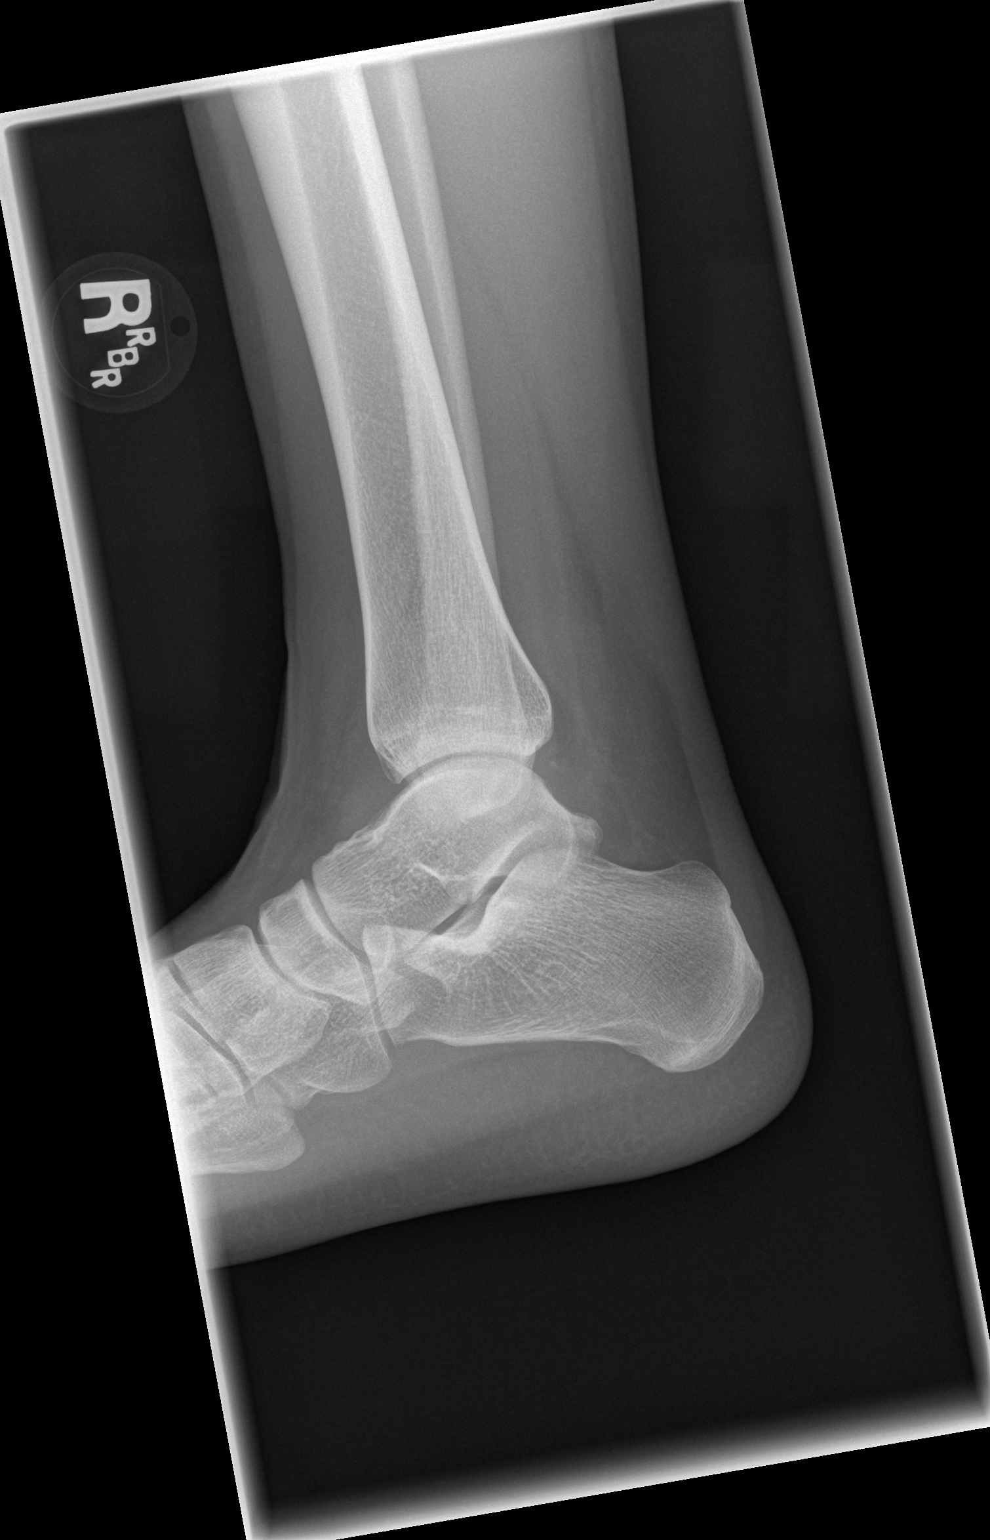

[3 of 3 positions shown; findings below may reference images not displayed]

FINDINGS: The ankle mortise is maintained. No acute ankle fracture or
osteochondral abnormality. The mid and hindfoot bony structures
appear intact.
IMPRESSION: No acute ankle fracture.

## 2018-10-06 ENCOUNTER — Encounter (HOSPITAL_BASED_OUTPATIENT_CLINIC_OR_DEPARTMENT_OTHER): Payer: Self-pay | Admitting: Emergency Medicine

## 2018-10-06 ENCOUNTER — Other Ambulatory Visit: Payer: Self-pay

## 2018-10-06 ENCOUNTER — Emergency Department (HOSPITAL_BASED_OUTPATIENT_CLINIC_OR_DEPARTMENT_OTHER)
Admission: EM | Admit: 2018-10-06 | Discharge: 2018-10-06 | Disposition: A | Discharge status: Home / Self Care | Payer: Self-pay | Attending: Emergency Medicine | Admitting: Emergency Medicine

## 2018-10-06 DIAGNOSIS — Z79899 Other long term (current) drug therapy: Secondary | ICD-10-CM | POA: Insufficient documentation

## 2018-10-06 DIAGNOSIS — J45909 Unspecified asthma, uncomplicated: Secondary | ICD-10-CM | POA: Insufficient documentation

## 2018-10-06 DIAGNOSIS — R112 Nausea with vomiting, unspecified: Secondary | ICD-10-CM | POA: Insufficient documentation

## 2018-10-06 DIAGNOSIS — R197 Diarrhea, unspecified: Secondary | ICD-10-CM | POA: Insufficient documentation

## 2018-10-06 LAB — URINALYSIS, ROUTINE W REFLEX MICROSCOPIC
BILIRUBIN URINE: NEGATIVE
Glucose, UA: NEGATIVE mg/dL
Hgb urine dipstick: NEGATIVE
Ketones, ur: NEGATIVE mg/dL
NITRITE: NEGATIVE
PH: 7 (ref 5.0–8.0)
Protein, ur: NEGATIVE mg/dL
SPECIFIC GRAVITY, URINE: 1.02 (ref 1.005–1.030)

## 2018-10-06 LAB — URINALYSIS, MICROSCOPIC (REFLEX): RBC / HPF: NONE SEEN RBC/hpf (ref 0–5)

## 2018-10-06 LAB — COMPREHENSIVE METABOLIC PANEL
ALBUMIN: 4.3 g/dL (ref 3.5–5.0)
ALK PHOS: 55 U/L (ref 38–126)
ALT: 11 U/L (ref 0–44)
ANION GAP: 7 (ref 5–15)
AST: 22 U/L (ref 15–41)
BUN: 11 mg/dL (ref 6–20)
CALCIUM: 9.1 mg/dL (ref 8.9–10.3)
CO2: 25 mmol/L (ref 22–32)
Chloride: 106 mmol/L (ref 98–111)
Creatinine, Ser: 1.05 mg/dL — ABNORMAL HIGH (ref 0.44–1.00)
GFR calc Af Amer: >60 mL/min (ref >60)
GFR calc non Af Amer: >60 mL/min (ref >60)
GLUCOSE: 107 mg/dL — AB (ref 70–99)
Potassium: 3.7 mmol/L (ref 3.5–5.1)
SODIUM: 138 mmol/L (ref 135–145)
Total Bilirubin: 0.5 mg/dL (ref 0.3–1.2)
Total Protein: 7 g/dL (ref 6.5–8.1)

## 2018-10-06 LAB — CBC WITH DIFFERENTIAL/PLATELET
ABS IMMATURE GRANULOCYTES: 0.01 10*3/uL (ref 0.00–0.07)
BASOS ABS: 0 10*3/uL (ref 0.0–0.1)
Basophils Relative: 1 %
Eosinophils Absolute: 0.1 10*3/uL (ref 0.0–0.5)
Eosinophils Relative: 1 %
HCT: 40 % (ref 36.0–46.0)
HEMOGLOBIN: 13.5 g/dL (ref 12.0–15.0)
IMMATURE GRANULOCYTES: 0 %
LYMPHS PCT: 32 %
Lymphs Abs: 2.4 10*3/uL (ref 0.7–4.0)
MCH: 31.8 pg (ref 26.0–34.0)
MCHC: 33.8 g/dL (ref 30.0–36.0)
MCV: 94.3 fL (ref 80.0–100.0)
Monocytes Absolute: 0.5 10*3/uL (ref 0.1–1.0)
Monocytes Relative: 6 %
NEUTROS ABS: 4.5 10*3/uL (ref 1.7–7.7)
NRBC: 0 % (ref 0.0–0.2)
Neutrophils Relative %: 60 %
Platelets: 146 10*3/uL — ABNORMAL LOW (ref 150–400)
RBC: 4.24 MIL/uL (ref 3.87–5.11)
RDW: 11.7 % (ref 11.5–15.5)
WBC: 7.4 10*3/uL (ref 4.0–10.5)

## 2018-10-06 LAB — PREGNANCY, URINE: Preg Test, Ur: NEGATIVE

## 2018-10-06 LAB — LIPASE, BLOOD: Lipase: 33 U/L (ref 11–51)

## 2018-10-06 MED ORDER — SODIUM CHLORIDE 0.9 % IV BOLUS
1000.0000 mL | Freq: Once | INTRAVENOUS | Status: AC
  Administered 2018-10-06: 1000 mL via INTRAVENOUS

## 2018-10-06 NOTE — ED Provider Notes (Signed)
[facility] EMERGENCY DEPARTMENT Provider Note   CSN: [id number] Arrival date & time: 10/06/18  2128     History   Chief Complaint Chief Complaint  Patient presents with  . Diarrhea    HPI Kimberly Golden is a 27 y.o. female history of asthma here presenting with vomiting, diarrhea.  She states that she works in the [employer].  Patient states that she does get in contact with customers.  She states that this morning she had several episodes of diarrhea and then vomiting.  She states that she was able to keep food down.  Patient states that she has some low-grade temperature earlier in the day.  Denies any recent travel.  Patient states that she needs a work note to get back to work.  The history is provided by the patient.    Past Medical History:  Diagnosis Date  . Asthma     There are no active problems to display for this patient.   Past Surgical History:  Procedure Laterality Date  . ANTERIOR CRUCIATE LIGAMENT REPAIR       OB History   None      Home Medications    Prior to Admission medications   Medication Sig Start Date End Date Taking? Authorizing Provider  albuterol (PROVENTIL HFA;VENTOLIN HFA) 108 (90 Base) MCG/ACT inhaler Inhale into the lungs every 6 (six) hours as needed for wheezing or shortness of breath.   Yes [provider]  benzonatate (TESSALON) 100 MG capsule Take 1 capsule (100 mg total) by mouth every 8 (eight) hours. 02/12/18   Law, Waylan Boga, PA-C  cetirizine (ZYRTEC) 10 MG tablet Take 1 tablet (10 mg total) by mouth daily. 01/19/18   Massie Maroon, FNP  cetirizine-pseudoephedrine (ZYRTEC-D) 5-120 MG tablet Take 1 tablet by mouth 2 (two) times daily. 02/12/18   Law, Waylan Boga, PA-C  loperamide (IMODIUM A-D) 2 MG tablet Take 1 tablet (2 mg total) by mouth 4 (four) times daily as needed for diarrhea or loose stools. 08/21/18   Vanetta Mulders, MD  ondansetron (ZOFRAN) 4 MG tablet Take 1 tablet (4 mg total) by  mouth every 8 (eight) hours as needed for nausea or vomiting. 03/12/18   Tegeler, Canary Brim, MD    Family History Family History  Problem Relation Age of Onset  . Diabetes Father   . Cancer Paternal Grandmother     Social History Social History   Tobacco Use  . Smoking status: Never Smoker  . Smokeless tobacco: Never Used  Substance Use Topics  . Alcohol use: No    Frequency: Never    Comment: rarely  . Drug use: Yes    Types: Marijuana    Comment: denies current use     Allergies   Flagyl [metronidazole] and Ibuprofen   Review of Systems Review of Systems  Gastrointestinal: Positive for diarrhea and vomiting.  All other systems reviewed and are negative.    Physical Exam Updated Vital Signs BP (!) 140/91 (BP Location: Left Arm)   Pulse 77   Temp 98.1 F (36.7 C) (Oral)   Ht 5\' 3"  (1.6 m)   Wt 54.4 kg   LMP 09/19/2018   SpO2 100%   BMI 21.26 kg/m   Physical Exam  Constitutional: She appears well-developed and well-nourished.  HENT:  Head: Normocephalic.  MM slightly dry   Eyes: Pupils are equal, round, and reactive to light. Conjunctivae and EOM are normal.  Neck: Normal range of motion. Neck supple.  Cardiovascular: Normal rate, regular rhythm and normal heart sounds.  Pulmonary/Chest: Effort normal and breath sounds normal. No stridor. No respiratory distress. She has no wheezes.  Abdominal: Soft. Bowel sounds are normal. She exhibits no distension. There is no tenderness. There is no guarding.  Musculoskeletal: Normal range of motion.  Skin: Skin is warm. Capillary refill takes less than 2 seconds.  Psychiatric: She has a normal mood and affect.  Nursing note and vitals reviewed.    ED Treatments / Results  Labs (all labs ordered are listed, but only abnormal results are displayed) Labs Reviewed  CBC WITH DIFFERENTIAL/PLATELET - Abnormal; Notable for the following components:      Result Value   Platelets 146 (*)    All other components  within normal limits  COMPREHENSIVE METABOLIC PANEL - Abnormal; Notable for the following components:   Glucose, Bld 107 (*)    Creatinine, Ser 1.05 (*)    All other components within normal limits  URINALYSIS, ROUTINE W REFLEX MICROSCOPIC - Abnormal; Notable for the following components:   Leukocytes, UA TRACE (*)    All other components within normal limits  URINALYSIS, MICROSCOPIC (REFLEX) - Abnormal; Notable for the following components:   Bacteria, UA RARE (*)    All other components within normal limits  LIPASE, BLOOD  PREGNANCY, URINE    EKG None  Radiology No results found.  Procedures Procedures (including critical care time)  Medications Ordered in ED Medications  sodium chloride 0.9 % bolus 1,000 mL ( Intravenous Stopped 10/06/18 2302)     Initial Impression / Assessment and Plan / ED Course  I have reviewed the triage vital signs and the nursing notes.  Pertinent labs & imaging results that were available during my care of the patient were reviewed by me and considered in my medical decision making (see chart for details).    Kimberly Golden is a 27 y.o. female here with vomiting, diarrhea. Works in [employer]. Likely viral gastro. Abdomen nontender. Will get labs, UA. Will hydrate and reassess.   11:19 PM Labs unremarkable. Abdomen remained nontender. No vomiting in the ED. Stable for discharge. 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    Final Clinical Impressions(s) / ED Diagnoses   Final diagnoses:  None    ED Discharge Orders    None       Charlynne Pander, MD 10/06/18 2320

## 2018-10-06 NOTE — Discharge Instructions (Signed)
Stay hydrated.   See your doctor.   Stay home and rest tomorrow.   You likely have a stomach virus   Return to ER if you have fever, vomiting, dehydration, uncontrolled abdominal pain.

## 2018-10-06 NOTE — ED Triage Notes (Signed)
Patient has had diarrhea since last night  - patient states that she vomited x 1 last night  - patient states that she needs a work to go back to work so she came here

## 2018-12-13 ENCOUNTER — Encounter (HOSPITAL_BASED_OUTPATIENT_CLINIC_OR_DEPARTMENT_OTHER): Payer: Self-pay | Admitting: Emergency Medicine

## 2018-12-13 ENCOUNTER — Other Ambulatory Visit: Payer: Self-pay

## 2018-12-13 ENCOUNTER — Emergency Department (HOSPITAL_BASED_OUTPATIENT_CLINIC_OR_DEPARTMENT_OTHER)
Admission: EM | Admit: 2018-12-13 | Discharge: 2018-12-13 | Disposition: A | Discharge status: Home / Self Care | Payer: Self-pay | Attending: Emergency Medicine | Admitting: Emergency Medicine

## 2018-12-13 DIAGNOSIS — S161XXA Strain of muscle, fascia and tendon at neck level, initial encounter: Secondary | ICD-10-CM

## 2018-12-13 DIAGNOSIS — Y999 Unspecified external cause status: Secondary | ICD-10-CM | POA: Insufficient documentation

## 2018-12-13 DIAGNOSIS — X58XXXA Exposure to other specified factors, initial encounter: Secondary | ICD-10-CM | POA: Insufficient documentation

## 2018-12-13 DIAGNOSIS — Y929 Unspecified place or not applicable: Secondary | ICD-10-CM | POA: Insufficient documentation

## 2018-12-13 DIAGNOSIS — J45909 Unspecified asthma, uncomplicated: Secondary | ICD-10-CM | POA: Insufficient documentation

## 2018-12-13 DIAGNOSIS — S1090XA Unspecified superficial injury of unspecified part of neck, initial encounter: Secondary | ICD-10-CM | POA: Insufficient documentation

## 2018-12-13 DIAGNOSIS — Y939 Activity, unspecified: Secondary | ICD-10-CM | POA: Insufficient documentation

## 2018-12-13 MED ORDER — ACETAMINOPHEN ER 650 MG PO TBCR: 650.0000 mg | EXTENDED_RELEASE_TABLET | ORAL | 0 refills | Status: DC | PRN

## 2018-12-13 MED ORDER — METHOCARBAMOL 500 MG PO TABS: 500.0000 mg | ORAL_TABLET | Freq: Three times a day (TID) | ORAL | 0 refills | Status: DC | PRN

## 2018-12-13 MED FILL — METHOCARBAMOL 500 MG TABLET: 500 | 4 days supply | Qty: 12 | Fill #0

## 2018-12-13 NOTE — ED Notes (Signed)
ED Provider at bedside. 

## 2018-12-13 NOTE — ED Triage Notes (Signed)
Neck pain x 2 days. Denies injury. States she think she slept wrong.

## 2018-12-13 NOTE — ED Notes (Signed)
Pt verbalized understanding of dc instructions.

## 2018-12-13 NOTE — ED Provider Notes (Signed)
[facility] EMERGENCY DEPARTMENT Provider Note   CSN: [id number] Arrival date & time: 12/13/18  0759     History   Chief Complaint Chief Complaint  Patient presents with  . Neck Pain    HPI Kimberly Golden is a 28 y.o. female.  HPI Patient is a 28 year old female presents the emergency department with complaints of neck stiffness and pain over the past 2 days.  This is posterior neck pain.  This occurred when she awoke 2 days ago.  She states it is worse with turning her head to the left and right.  Her pain is worse with stretching her neck.  She denies sore throat.  No difficulty breathing.  No fevers or chills.  No recent injury or trauma.  Denies heavy lifting.  No weakness or paresthesias into her arms or legs.  No other complaints.  Tried Tylenol yesterday without improvement   Past Medical History:  Diagnosis Date  . Asthma     There are no active problems to display for this patient.   Past Surgical History:  Procedure Laterality Date  . ANTERIOR CRUCIATE LIGAMENT REPAIR       OB History   No obstetric history on file.      Home Medications    Prior to Admission medications   Medication Sig Start Date End Date Taking? Authorizing Provider  acetaminophen (TYLENOL 8 HOUR) 650 MG CR tablet Take 1 tablet (650 mg total) by mouth every 4 (four) hours as needed for pain. 12/13/18   Azalia Bilisampos, Emanuelle Hammerstrom, MD  albuterol (PROVENTIL HFA;VENTOLIN HFA) 108 (90 Base) MCG/ACT inhaler Inhale into the lungs every 6 (six) hours as needed for wheezing or shortness of breath.    [provider]  methocarbamol (ROBAXIN) 500 MG tablet Take 1 tablet (500 mg total) by mouth every 8 (eight) hours as needed for muscle spasms. 12/13/18   Azalia Bilisampos, Rosemarie Galvis, MD    Family History Family History  Problem Relation Age of Onset  . Diabetes Father   . Cancer Paternal Grandmother     Social History Social History   Tobacco Use  . Smoking status: Never Smoker  . Smokeless tobacco:  Never Used  Substance Use Topics  . Alcohol use: No    Frequency: Never    Comment: rarely  . Drug use: Yes    Types: Marijuana    Comment: denies current use     Allergies   Flagyl [metronidazole] and Ibuprofen   Review of Systems Review of Systems  All other systems reviewed and are negative.    Physical Exam Updated Vital Signs BP 117/82 (BP Location: Right Arm)   Pulse 75   Temp 98.2 F (36.8 C) (Oral)   Resp 16   Ht 5\' 3"  (1.6 m)   Wt 54.4 kg   LMP 11/24/2018   SpO2 99%   BMI 21.26 kg/m   Physical Exam Vitals signs and nursing note reviewed.  Constitutional:      Appearance: She is well-developed.  HENT:     Head: Normocephalic.  Neck:     Musculoskeletal: Normal range of motion and neck supple. No neck rigidity.     Comments: Mild paracervical tenderness bilaterally.  Mild tenderness into her bilateral trapezius muscles.  No rash Pulmonary:     Effort: Pulmonary effort is normal.  Abdominal:     General: There is no distension.  Musculoskeletal: Normal range of motion.     Comments: No C-spine tenderness.  Normal radial pulses bilaterally.  5 out of 5 strength of bilateral upper extremity major muscle groups.  Lymphadenopathy:     Cervical: No cervical adenopathy.  Neurological:     Mental Status: She is alert and oriented to person, place, and time.      ED Treatments / Results  Labs (all labs ordered are listed, but only abnormal results are displayed) Labs Reviewed - No data to display  EKG None  Radiology No results found.  Procedures Procedures (including critical care time)  Medications Ordered in ED Medications - No data to display   Initial Impression / Assessment and Plan / ED Course  I have reviewed the triage vital signs and the nursing notes.  Pertinent labs & imaging results that were available during my care of the patient were reviewed by me and considered in my medical decision making (see chart for details).      Musculoskeletal pain.  Recommended anti-inflammatories and muscle relaxants as well as heat.  Primary care follow-up  Final Clinical Impressions(s) / ED Diagnoses   Final diagnoses:  Acute strain of neck muscle, initial encounter    ED Discharge Orders         Ordered    methocarbamol (ROBAXIN) 500 MG tablet  Every 8 hours PRN     12/13/18 0820    acetaminophen (TYLENOL 8 HOUR) 650 MG CR tablet  Every 4 hours PRN     12/13/18 0820           Azalia Bilisampos, Elchanan Bob, MD 12/13/18 [id number]

## 2018-12-25 ENCOUNTER — Other Ambulatory Visit: Payer: Self-pay

## 2018-12-25 ENCOUNTER — Encounter (HOSPITAL_BASED_OUTPATIENT_CLINIC_OR_DEPARTMENT_OTHER): Payer: Self-pay | Admitting: *Deleted

## 2018-12-25 ENCOUNTER — Emergency Department (HOSPITAL_BASED_OUTPATIENT_CLINIC_OR_DEPARTMENT_OTHER)
Admission: EM | Admit: 2018-12-25 | Discharge: 2018-12-25 | Disposition: A | Discharge status: Home / Self Care | Payer: Self-pay | Attending: Emergency Medicine | Admitting: Emergency Medicine

## 2018-12-25 DIAGNOSIS — S39012A Strain of muscle, fascia and tendon of lower back, initial encounter: Secondary | ICD-10-CM | POA: Insufficient documentation

## 2018-12-25 DIAGNOSIS — Y9389 Activity, other specified: Secondary | ICD-10-CM | POA: Insufficient documentation

## 2018-12-25 DIAGNOSIS — J45909 Unspecified asthma, uncomplicated: Secondary | ICD-10-CM | POA: Insufficient documentation

## 2018-12-25 DIAGNOSIS — Y999 Unspecified external cause status: Secondary | ICD-10-CM | POA: Insufficient documentation

## 2018-12-25 DIAGNOSIS — Y92410 Unspecified street and highway as the place of occurrence of the external cause: Secondary | ICD-10-CM | POA: Insufficient documentation

## 2018-12-25 DIAGNOSIS — Z79899 Other long term (current) drug therapy: Secondary | ICD-10-CM | POA: Insufficient documentation

## 2018-12-25 MED ORDER — NAPROXEN 500 MG PO TABS: 500.0000 mg | ORAL_TABLET | Freq: Two times a day (BID) | ORAL | 0 refills | Status: DC

## 2018-12-25 MED ORDER — NAPROXEN 250 MG PO TABS
500.0000 mg | ORAL_TABLET | Freq: Once | ORAL | Status: AC
  Administered 2018-12-25: 500 mg via ORAL
  Filled 2018-12-25: qty 2

## 2018-12-25 NOTE — ED Notes (Signed)
ED Provider at bedside. 
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
## 2018-12-25 NOTE — Discharge Instructions (Addendum)
You may alternate Tylenol 1000 mg every 6 hours as needed for pain and naproxen 500 mg every 12 hours as needed for pain.

## 2019-01-10 ENCOUNTER — Emergency Department (HOSPITAL_BASED_OUTPATIENT_CLINIC_OR_DEPARTMENT_OTHER)
Admission: EM | Admit: 2019-01-10 | Discharge: 2019-01-10 | Disposition: A | Discharge status: Home / Self Care | Payer: Self-pay | Attending: Emergency Medicine | Admitting: Emergency Medicine

## 2019-01-10 ENCOUNTER — Other Ambulatory Visit: Payer: Self-pay

## 2019-01-10 ENCOUNTER — Encounter (HOSPITAL_BASED_OUTPATIENT_CLINIC_OR_DEPARTMENT_OTHER): Payer: Self-pay

## 2019-01-10 DIAGNOSIS — J45909 Unspecified asthma, uncomplicated: Secondary | ICD-10-CM | POA: Insufficient documentation

## 2019-01-10 DIAGNOSIS — R112 Nausea with vomiting, unspecified: Secondary | ICD-10-CM | POA: Insufficient documentation

## 2019-01-10 DIAGNOSIS — M791 Myalgia, unspecified site: Secondary | ICD-10-CM | POA: Insufficient documentation

## 2019-01-10 DIAGNOSIS — F1210 Cannabis abuse, uncomplicated: Secondary | ICD-10-CM | POA: Insufficient documentation

## 2019-01-10 LAB — URINALYSIS, MICROSCOPIC (REFLEX)

## 2019-01-10 LAB — URINALYSIS, ROUTINE W REFLEX MICROSCOPIC
Glucose, UA: NEGATIVE mg/dL
HGB URINE DIPSTICK: NEGATIVE
Nitrite: NEGATIVE
Protein, ur: 30 mg/dL — AB
pH: 6 (ref 5.0–8.0)

## 2019-01-10 LAB — CBC WITH DIFFERENTIAL/PLATELET
Abs Immature Granulocytes: 0.02 10*3/uL (ref 0.00–0.07)
Basophils Absolute: 0.1 10*3/uL (ref 0.0–0.1)
Basophils Relative: 1 %
Eosinophils Absolute: 0 10*3/uL (ref 0.0–0.5)
Eosinophils Relative: 0 %
HCT: 41.7 % (ref 36.0–46.0)
Hemoglobin: 13.8 g/dL (ref 12.0–15.0)
Immature Granulocytes: 0 %
LYMPHS ABS: 1.6 10*3/uL (ref 0.7–4.0)
Lymphocytes Relative: 21 %
MCH: 31.7 pg (ref 26.0–34.0)
MCHC: 33.1 g/dL (ref 30.0–36.0)
MCV: 95.6 fL (ref 80.0–100.0)
MONOS PCT: 6 %
Monocytes Absolute: 0.5 10*3/uL (ref 0.1–1.0)
NEUTROS PCT: 72 %
Neutro Abs: 5.6 10*3/uL (ref 1.7–7.7)
Platelets: 154 10*3/uL (ref 150–400)
RBC: 4.36 MIL/uL (ref 3.87–5.11)
RDW: 11.6 % (ref 11.5–15.5)
WBC: 7.7 10*3/uL (ref 4.0–10.5)
nRBC: 0 % (ref 0.0–0.2)

## 2019-01-10 LAB — COMPREHENSIVE METABOLIC PANEL
ALT: 12 U/L (ref 0–44)
AST: 19 U/L (ref 15–41)
Albumin: 4.7 g/dL (ref 3.5–5.0)
Alkaline Phosphatase: 48 U/L (ref 38–126)
Anion gap: 9 (ref 5–15)
BUN: 12 mg/dL (ref 6–20)
CO2: 22 mmol/L (ref 22–32)
Calcium: 9.3 mg/dL (ref 8.9–10.3)
Chloride: 107 mmol/L (ref 98–111)
Creatinine, Ser: 0.83 mg/dL (ref 0.44–1.00)
GFR calc Af Amer: >60 mL/min (ref >60)
GFR calc non Af Amer: >60 mL/min (ref >60)
Glucose, Bld: 74 mg/dL (ref 70–99)
POTASSIUM: 3.6 mmol/L (ref 3.5–5.1)
Sodium: 138 mmol/L (ref 135–145)
Total Bilirubin: 1 mg/dL (ref 0.3–1.2)
Total Protein: 7.2 g/dL (ref 6.5–8.1)

## 2019-01-10 LAB — PREGNANCY, URINE: PREG TEST UR: NEGATIVE

## 2019-01-10 LAB — LIPASE, BLOOD: Lipase: 23 U/L (ref 11–51)

## 2019-01-10 MED ORDER — ONDANSETRON 4 MG PO TBDP
4.0000 mg | ORAL_TABLET | Freq: Once | ORAL | Status: AC
  Administered 2019-01-10: 4 mg via ORAL
  Filled 2019-01-10: qty 1

## 2019-01-10 MED ORDER — ONDANSETRON 4 MG PO TBDP: 4.0000 mg | ORAL_TABLET | Freq: Three times a day (TID) | ORAL | 0 refills | Status: DC | PRN

## 2019-01-10 NOTE — ED Triage Notes (Signed)
C/o "body aches" x 3 days-vomited x 1 today at work-states she was sent home-NAD-steady gait

## 2019-01-10 NOTE — ED Provider Notes (Signed)
[facility] EMERGENCY DEPARTMENT Provider Note   CSN: [id number] Arrival date & time: 01/10/19  1100     History   Chief Complaint Chief Complaint  Patient presents with  . Generalized Body Aches    HPI Kimberly Golden is a 28 y.o. female.  The history is provided by the patient and medical records. No language interpreter was used.   Kimberly Golden is a 28 y.o. female  with a PMH of asthma who presents to the Emergency Department complaining of generalized abdominal pain associated with body aches and nausea x 3 days. She had one episode of emesis at onset 3 days ago, then one episode earlier today. No fever, chills, back pain, diarrhea, constipation, blood in her stool, dysuria or vaginal discharge.  No medications prior to arrival for her symptoms.  She actually feels as if she is improving, however she had an episode of emesis today at work and her job sent her home telling her to go to the doctor.  She denies any known sick contacts.  No recent travel.  No strange water or food intake.  No recent antibiotics.  Past Medical History:  Diagnosis Date  . Asthma     There are no active problems to display for this patient.   Past Surgical History:  Procedure Laterality Date  . ANTERIOR CRUCIATE LIGAMENT REPAIR       OB History   No obstetric history on file.      Home Medications    Prior to Admission medications   Medication Sig Start Date End Date Taking? Authorizing Provider  acetaminophen (TYLENOL 8 HOUR) 650 MG CR tablet Take 1 tablet (650 mg total) by mouth every 4 (four) hours as needed for pain. 12/13/18   Azalia Bilisampos, Kevin, MD  albuterol (PROVENTIL HFA;VENTOLIN HFA) 108 (90 Base) MCG/ACT inhaler Inhale into the lungs every 6 (six) hours as needed for wheezing or shortness of breath.    [provider]  methocarbamol (ROBAXIN) 500 MG tablet Take 1 tablet (500 mg total) by mouth every 8 (eight) hours as needed for muscle spasms. 12/13/18   Azalia Bilisampos,  Kevin, MD  naproxen (NAPROSYN) 500 MG tablet Take 1 tablet (500 mg total) by mouth 2 (two) times daily with a meal. 12/25/18   Korban Shearer, Layla MawKristen N, DO  ondansetron (ZOFRAN ODT) 4 MG disintegrating tablet Take 1 tablet (4 mg total) by mouth every 8 (eight) hours as needed for nausea or vomiting. 01/10/19   Sebastian Dzik, Chase PicketJaime Pilcher, PA-C    Family History Family History  Problem Relation Age of Onset  . Diabetes Father   . Cancer Paternal Grandmother     Social History Social History   Tobacco Use  . Smoking status: Never Smoker  . Smokeless tobacco: Never Used  Substance Use Topics  . Alcohol use: Not Currently    Frequency: Never  . Drug use: Yes    Types: Marijuana     Allergies   Flagyl [metronidazole] and Ibuprofen   Review of Systems Review of Systems  Gastrointestinal: Positive for abdominal pain, nausea and vomiting. Negative for blood in stool, constipation and diarrhea.  Musculoskeletal: Positive for myalgias.  All other systems reviewed and are negative.    Physical Exam Updated Vital Signs BP (!) 111/91   Pulse 79   Temp 98.4 F (36.9 C) (Oral)   Resp 18   Ht 5\' 3"  (1.6 m)   Wt 54.2 kg   LMP 12/26/2018   SpO2 100%   BMI  21.17 kg/m   Physical Exam Vitals signs and nursing note reviewed.  Constitutional:      General: She is not in acute distress.    Appearance: She is well-developed.  HENT:     Head: Normocephalic and atraumatic.  Neck:     Musculoskeletal: Neck supple.  Cardiovascular:     Rate and Rhythm: Normal rate and regular rhythm.     Heart sounds: Normal heart sounds. No murmur.  Pulmonary:     Effort: Pulmonary effort is normal. No respiratory distress.     Breath sounds: Normal breath sounds.  Abdominal:     General: There is no distension.     Palpations: Abdomen is soft.     Comments: Mild generalized abdominal tenderness without rebound or guarding.  Negative Murphy's.  No focal tenderness at McBurney's.  No CVA tenderness.  Skin:     General: Skin is warm and dry.  Neurological:     Mental Status: She is alert and oriented to person, place, and time.      ED Treatments / Results  Labs (all labs ordered are listed, but only abnormal results are displayed) Labs Reviewed  URINALYSIS, ROUTINE W REFLEX MICROSCOPIC - Abnormal; Notable for the following components:      Result Value   Color, Urine AMBER (*)    Specific Gravity, Urine >1.030 (*)    Bilirubin Urine SMALL (*)    Ketones, ur >80 (*)    Protein, ur 30 (*)    Leukocytes, UA TRACE (*)    All other components within normal limits  URINALYSIS, MICROSCOPIC (REFLEX) - Abnormal; Notable for the following components:   Bacteria, UA RARE (*)    All other components within normal limits  PREGNANCY, URINE  CBC WITH DIFFERENTIAL/PLATELET  COMPREHENSIVE METABOLIC PANEL  LIPASE, BLOOD    EKG None  Radiology No results found.  Procedures Procedures (including critical care time)  Medications Ordered in ED Medications  ondansetron (ZOFRAN-ODT) disintegrating tablet 4 mg (4 mg Oral Given 01/10/19 1340)     Initial Impression / Assessment and Plan / ED Course  I have reviewed the triage vital signs and the nursing notes.  Pertinent labs & imaging results that were available during my care of the patient were reviewed by me and considered in my medical decision making (see chart for details).    Kimberly Golden is a 28 y.o. female who presents to ED for generalized abdominal pain for the last 3 days associated with nausea.  She had one episode of emesis 3 days ago and then one episode today.  She actually feels as if her symptoms are improving.  On exam, she is nontoxic, nonseptic appearing with nonsurgical abdominal exam.  She has no focal tenderness at McBurney's point.  Negative Murphy's sign.  Given ODT Zofran and on reevaluation, she feels improved.  She is tolerating p.o. with no episodes of vomiting while in the emergency department.  Repeat abdominal exam  with no peritoneal signs and quite reassuring. UA without signs of infection. Labs reassuring. Normal white count. Patient does not meet the SIRS or Sepsis criteria. No indication of bowel obstruction, bowel perforation, cholecystitis, diverticulitis, PID or ectopic pregnancy.  Given her current abdominal exam with no RLQ tenderness, improving symptoms, normal white count, no fever, doubt appendicitis, however did discuss the small possibility of this at length with the patient.  Shared decision making and she is in agreement no further imaging for this at this point given low likelihood, however,  we spoke about return precautions at length.  She understands to return to ER for fever, worsening or right lower quadrant abdominal pain, uncontrolled vomiting, new or concerning symptoms or any additional concerns. Patient discharged home with symptomatic treatment and encouraged to follow up with PCP. Patient expresses understanding and agrees with plan as dictated above.   Final Clinical Impressions(s) / ED Diagnoses   Final diagnoses:  Non-intractable vomiting with nausea, unspecified vomiting type    ED Discharge Orders         Ordered    ondansetron (ZOFRAN ODT) 4 MG disintegrating tablet  Every 8 hours PRN     01/10/19 1434           Imonie Tuch, Chase Picket, PA-C 01/10/19 1506    Linwood Dibbles, MD 01/11/19 [phone]

## 2019-01-10 NOTE — ED Notes (Signed)
Pt given [employer]

## 2019-01-10 NOTE — Discharge Instructions (Signed)
It was my pleasure taking care of you today!   Fortunately, your lab work and urine was very reassuring.   At this time, there does not appear to be an acute, emergent cause for your abdominal pain and nausea. However, this does not mean that your abdominal pain may not become an emergency in the future. It is VERY important that you monitor your symptoms and return to the Emergency Department if you develop any of the following symptoms:  The pain does not go away or hurts very bad in the lower right part of your abdomen. You have a fever.  You keep throwing up and can't keep fluids down. You pass bloody or black tarry stools.  There is bright red blood in the stool. You do not seem to be getting better.  You have any questions or concerns.

## 2019-01-10 NOTE — ED Notes (Signed)
NAD at this time. Pt is stable and going home.  

## 2019-01-13 ENCOUNTER — Encounter (HOSPITAL_BASED_OUTPATIENT_CLINIC_OR_DEPARTMENT_OTHER): Payer: Self-pay | Admitting: Emergency Medicine

## 2019-01-13 ENCOUNTER — Emergency Department (HOSPITAL_BASED_OUTPATIENT_CLINIC_OR_DEPARTMENT_OTHER)
Admission: EM | Admit: 2019-01-13 | Discharge: 2019-01-13 | Disposition: A | Discharge status: Home / Self Care | Payer: Self-pay | Attending: Emergency Medicine | Admitting: Emergency Medicine

## 2019-01-13 ENCOUNTER — Other Ambulatory Visit: Payer: Self-pay

## 2019-01-13 DIAGNOSIS — B9789 Other viral agents as the cause of diseases classified elsewhere: Secondary | ICD-10-CM

## 2019-01-13 DIAGNOSIS — J069 Acute upper respiratory infection, unspecified: Secondary | ICD-10-CM | POA: Insufficient documentation

## 2019-01-13 DIAGNOSIS — Z79899 Other long term (current) drug therapy: Secondary | ICD-10-CM | POA: Insufficient documentation

## 2019-01-13 MED ORDER — BENZONATATE 100 MG PO CAPS
200.0000 mg | ORAL_CAPSULE | Freq: Once | ORAL | Status: AC
  Administered 2019-01-13: 200 mg via ORAL
  Filled 2019-01-13: qty 2

## 2019-01-13 MED ORDER — BENZONATATE 100 MG PO CAPS: 100.0000 mg | ORAL_CAPSULE | Freq: Three times a day (TID) | ORAL | 0 refills | Status: DC

## 2019-01-13 MED ORDER — ACETAMINOPHEN 500 MG PO TABS
1000.0000 mg | ORAL_TABLET | Freq: Once | ORAL | Status: AC
  Administered 2019-01-13: 1000 mg via ORAL
  Filled 2019-01-13: qty 2

## 2019-01-13 MED ORDER — FLUTICASONE PROPIONATE 50 MCG/ACT NA SUSP: 2.0000 | Freq: Every day | NASAL | 0 refills | Status: DC

## 2019-01-13 MED ORDER — LORATADINE 10 MG PO TABS
10.0000 mg | ORAL_TABLET | Freq: Once | ORAL | Status: AC
  Administered 2019-01-13: 10 mg via ORAL
  Filled 2019-01-13: qty 1

## 2019-01-13 MED ORDER — GUAIFENESIN 100 MG/5ML PO SOLN
5.0000 mL | Freq: Once | ORAL | Status: AC
  Administered 2019-01-13: 100 mg via ORAL
  Filled 2019-01-13: qty 10

## 2019-01-13 NOTE — ED Provider Notes (Signed)
[facility] EMERGENCY DEPARTMENT Provider Note   CSN: [id number] Arrival date & time: 01/13/19  0515     History   Chief Complaint Chief Complaint  Patient presents with  . Cough    HPI Kimberly Golden is a 28 y.o. female.  The history is provided by the patient.  Cough  Cough characteristics:  Non-productive Severity:  Moderate Onset quality:  Gradual Duration:  2 weeks Timing:  Intermittent Progression:  Unchanged Chronicity:  New Context: sick contacts   Context: not animal exposure and not exposure to allergens   Relieved by:  Nothing Worsened by:  Nothing Ineffective treatments:  None tried Associated symptoms: sinus congestion   Associated symptoms: no chest pain, no diaphoresis, no eye discharge, no fever, no myalgias, no shortness of breath, no weight loss and no wheezing   Risk factors: no chemical exposure   Was seen 2 days ago for n/v/d.  Did not mention these other ongoing complaints.    Past Medical History:  Diagnosis Date  . Asthma     There are no active problems to display for this patient.   Past Surgical History:  Procedure Laterality Date  . ANTERIOR CRUCIATE LIGAMENT REPAIR       OB History   No obstetric history on file.      Home Medications    Prior to Admission medications   Medication Sig Start Date End Date Taking? Authorizing Provider  acetaminophen (TYLENOL 8 HOUR) 650 MG CR tablet Take 1 tablet (650 mg total) by mouth every 4 (four) hours as needed for pain. 12/13/18   Azalia Bilis, MD  albuterol (PROVENTIL HFA;VENTOLIN HFA) 108 (90 Base) MCG/ACT inhaler Inhale into the lungs every 6 (six) hours as needed for wheezing or shortness of breath.    [provider]  methocarbamol (ROBAXIN) 500 MG tablet Take 1 tablet (500 mg total) by mouth every 8 (eight) hours as needed for muscle spasms. 12/13/18   Azalia Bilis, MD  naproxen (NAPROSYN) 500 MG tablet Take 1 tablet (500 mg total) by mouth 2 (two) times daily with  a meal. 12/25/18   Ward, Layla Maw, DO  ondansetron (ZOFRAN ODT) 4 MG disintegrating tablet Take 1 tablet (4 mg total) by mouth every 8 (eight) hours as needed for nausea or vomiting. 01/10/19   Ward, Chase Picket, PA-C    Family History Family History  Problem Relation Age of Onset  . Diabetes Father   . Cancer Paternal Grandmother     Social History Social History   Tobacco Use  . Smoking status: Never Smoker  . Smokeless tobacco: Never Used  Substance Use Topics  . Alcohol use: Not Currently    Frequency: Never  . Drug use: Yes    Types: Marijuana     Allergies   Flagyl [metronidazole] and Ibuprofen   Review of Systems Review of Systems  Constitutional: Negative for diaphoresis, fatigue, fever and weight loss.  Eyes: Negative for discharge.  Respiratory: Positive for cough. Negative for shortness of breath and wheezing.   Cardiovascular: Negative for chest pain.  Gastrointestinal: Negative for abdominal pain, diarrhea, nausea and vomiting.  Musculoskeletal: Negative for myalgias and neck pain.  All other systems reviewed and are negative.    Physical Exam Updated Vital Signs BP 113/88 (BP Location: Right Arm)   Pulse 85   Temp 98.2 F (36.8 C) (Oral)   Resp 18   Ht 5\' 3"  (1.6 m)   Wt 54.2 kg   LMP 12/26/2018  SpO2 100%   BMI 21.17 kg/m   Physical Exam Vitals signs and nursing note reviewed.  Constitutional:      General: She is not in acute distress.    Appearance: Normal appearance. She is normal weight.  HENT:     Head: Normocephalic and atraumatic.     Nose: Nose normal.     Mouth/Throat:     Mouth: Mucous membranes are moist.     Pharynx: Oropharynx is clear.  Eyes:     Conjunctiva/sclera: Conjunctivae normal.     Pupils: Pupils are equal, round, and reactive to light.  Neck:     Musculoskeletal: Normal range of motion and neck supple.  Cardiovascular:     Rate and Rhythm: Normal rate and regular rhythm.     Pulses: Normal pulses.      Heart sounds: Normal heart sounds.  Pulmonary:     Effort: Pulmonary effort is normal. No respiratory distress.     Breath sounds: Normal breath sounds. No stridor. No wheezing, rhonchi or rales.  Chest:     Chest wall: No tenderness.  Abdominal:     General: Abdomen is flat. Bowel sounds are normal.     Tenderness: There is no abdominal tenderness.  Musculoskeletal: Normal range of motion.  Skin:    General: Skin is warm and dry.     Capillary Refill: Capillary refill takes less than 2 seconds.  Neurological:     General: No focal deficit present.     Mental Status: She is alert and oriented to person, place, and time.  Psychiatric:        Mood and Affect: Mood normal.        Behavior: Behavior normal.      ED Treatments / Results  Labs (all labs ordered are listed, but only abnormal results are displayed) Labs Reviewed - No data to display  EKG None  Radiology No results found.  Procedures Procedures (including critical care time)  Medications Ordered in ED Medications  loratadine (CLARITIN) tablet 10 mg (has no administration in time range)  benzonatate (TESSALON) capsule 200 mg (has no administration in time range)  guaiFENesin (ROBITUSSIN) 100 MG/5ML solution 100 mg (has no administration in time range)  acetaminophen (TYLENOL) tablet 1,000 mg (has no administration in time range)     Well appearing, no coughing in the ED.  Lungs are clear.  There is no indication for imaging.  PERC negative wells 0 highly doubt PE.     Final Clinical Impressions(s) / ED Diagnoses   Return for pain, intractable cough, fevers >100.4 unrelieved by medication, shortness of breath, intractable vomiting, chest pain, shortness of breath, weakness numbness, changes in speech, facial asymmetry,abdominal pain, passing out,Inability to tolerate liquids or food, cough, altered mental status or any concerns. No signs of systemic illness or infection. The patient is nontoxic-appearing  on exam and vital signs are within normal limits.   I have reviewed the triage vital signs and the nursing notes. Pertinent labs &imaging results that were available during my care of the patient were reviewed by me and considered in my medical decision making (see chart for details).  After history, exam, and medical workup I feel the patient has been appropriately medically screened and is safe for discharge home. Pertinent diagnoses were discussed with the patient. Patient was given return precautions.   Kallyn Demarcus, MD 01/13/19 [phone]

## 2019-01-13 NOTE — ED Triage Notes (Signed)
Pt c/o cold like symptoms for the past 2 weeks, not getting any better.

## 2019-03-07 ENCOUNTER — Encounter: Payer: Self-pay | Admitting: Family Medicine

## 2019-03-07 ENCOUNTER — Other Ambulatory Visit: Payer: Self-pay

## 2019-03-07 ENCOUNTER — Ambulatory Visit (INDEPENDENT_AMBULATORY_CARE_PROVIDER_SITE_OTHER): Payer: Self-pay | Admitting: Family Medicine

## 2019-03-07 VITALS — BP 114/80 | HR 76 | Temp 98.1°F | Ht 63.0 in | Wt 117.0 lb

## 2019-03-07 DIAGNOSIS — Z803 Family history of malignant neoplasm of breast: Secondary | ICD-10-CM | POA: Insufficient documentation

## 2019-03-07 DIAGNOSIS — Z09 Encounter for follow-up examination after completed treatment for conditions other than malignant neoplasm: Secondary | ICD-10-CM

## 2019-03-07 DIAGNOSIS — R0602 Shortness of breath: Secondary | ICD-10-CM | POA: Insufficient documentation

## 2019-03-07 DIAGNOSIS — R059 Cough, unspecified: Secondary | ICD-10-CM | POA: Insufficient documentation

## 2019-03-07 DIAGNOSIS — N6002 Solitary cyst of left breast: Secondary | ICD-10-CM | POA: Insufficient documentation

## 2019-03-07 DIAGNOSIS — N644 Mastodynia: Secondary | ICD-10-CM

## 2019-03-07 DIAGNOSIS — H6123 Impacted cerumen, bilateral: Secondary | ICD-10-CM | POA: Insufficient documentation

## 2019-03-07 DIAGNOSIS — J302 Other seasonal allergic rhinitis: Secondary | ICD-10-CM | POA: Insufficient documentation

## 2019-03-07 DIAGNOSIS — R05 Cough: Secondary | ICD-10-CM

## 2019-03-07 DIAGNOSIS — J45909 Unspecified asthma, uncomplicated: Secondary | ICD-10-CM | POA: Insufficient documentation

## 2019-03-07 DIAGNOSIS — Z7689 Persons encountering health services in other specified circumstances: Secondary | ICD-10-CM

## 2019-03-07 DIAGNOSIS — Z1231 Encounter for screening mammogram for malignant neoplasm of breast: Secondary | ICD-10-CM

## 2019-03-07 MED ORDER — ALBUTEROL SULFATE HFA 108 (90 BASE) MCG/ACT IN AERS: 1.0000 | INHALATION_SPRAY | Freq: Four times a day (QID) | RESPIRATORY_TRACT | 11 refills | Status: DC | PRN

## 2019-03-07 MED ORDER — CETIRIZINE HCL 10 MG PO TABS: 10.0000 mg | ORAL_TABLET | Freq: Every day | ORAL | 11 refills | Status: DC

## 2019-03-07 MED ORDER — ALBUTEROL SULFATE HFA 108 (90 BASE) MCG/ACT IN AERS: 1.0000 | INHALATION_SPRAY | Freq: Four times a day (QID) | RESPIRATORY_TRACT | 11 refills | Status: AC | PRN

## 2019-03-07 MED ORDER — MONTELUKAST SODIUM 10 MG PO TABS: 10.0000 mg | ORAL_TABLET | Freq: Every day | ORAL | 11 refills | Status: DC

## 2019-03-07 MED ORDER — ALBUTEROL SULFATE (2.5 MG/3ML) 0.083% IN NEBU: 2.5000 mg | INHALATION_SOLUTION | Freq: Four times a day (QID) | RESPIRATORY_TRACT | 11 refills | Status: AC | PRN

## 2019-03-07 MED ORDER — ALBUTEROL SULFATE (2.5 MG/3ML) 0.083% IN NEBU: 2.5000 mg | INHALATION_SOLUTION | Freq: Four times a day (QID) | RESPIRATORY_TRACT | 11 refills | Status: DC | PRN

## 2019-03-07 NOTE — Patient Instructions (Addendum)
Fluticasone nasal spray What is this medicine? FLUTICASONE (floo TIK a sone) is a corticosteroid. This medicine is used to treat the symptoms of allergies like sneezing, itchy red eyes, and itchy, runny, or stuffy nose. This medicine is also used to treat nasal polyps. This medicine may be used for other purposes; ask your health care provider or pharmacist if you have questions. COMMON BRAND NAME(S): ClariSpray, Flonase, Flonase Allergy Relief, Flonase Sensimist, Veramyst, XHANCE What should I tell my health care provider before I take this medicine? They need to know if you have any of these conditions: -eye disease, vision problems -infection, like tuberculosis, herpes, or fungal infection -recent surgery on nose or sinuses -taking a corticosteroid by mouth -an unusual or allergic reaction to fluticasone, steroids, other medicines, foods, dyes, or preservatives -pregnant or trying to get pregnant -breast-feeding How should I use this medicine? This medicine is for use in the nose. Follow the directions on your product or prescription label. This medicine works best if used at regular intervals. Do not use more often than directed. Make sure that you are using your nasal spray correctly. After 6 months of daily use for allergies, talk to your doctor or health care professional before using it for a longer time. Ask your doctor or health care professional if you have any questions. Talk to your pediatrician regarding the use of this medicine in children. Special care may be needed. Some products have been used for allergies in children as young as 2 years. After 2 months of daily use without a prescription in a child, talk to your pediatrician before using it for a longer time. Use of this medicine for nasal polyps is not approved in children. Overdosage: If you think you have taken too much of this medicine contact a poison control center or emergency room at once. NOTE: This medicine is only for  you. Do not share this medicine with others. What if I miss a dose? If you miss a dose, use it as soon as you remember. If it is almost time for your next dose, use only that dose and continue with your regular schedule. Do not use double or extra doses. What may interact with this medicine? -certain antibiotics like clarithromycin and telithromycin -certain medicines for fungal infections like ketoconazole, itraconazole, and voriconazole -conivaptan -nefazodone -some medicines for HIV -vaccines This list may not describe all possible interactions. Give your health care provider a list of all the medicines, herbs, non-prescription drugs, or dietary supplements you use. Also tell them if you smoke, drink alcohol, or use illegal drugs. Some items may interact with your medicine. What should I watch for while using this medicine? Visit your healthcare professional for regular checks on your progress. Tell your healthcare professional if your symptoms do not start to get better or if they get worse. This medicine may increase your risk of getting an infection. Tell your doctor or health care professional if you are around anyone with measles or chickenpox, or if you develop sores or blisters that do not heal properly. What side effects may I notice from receiving this medicine? Side effects that you should report to your doctor or health care professional as soon as possible: -allergic reactions like skin rash, itching or hives, swelling of the face, lips, or tongue -changes in vision -crusting or sores in the nose -nosebleed -signs and symptoms of infection like fever or chills; cough; sore throat -white patches or sores in the mouth or nose Side effects that  usually do not require medical attention (report to your doctor or health care professional if they continue or are bothersome): -burning or irritation inside the nose or throat -changes in taste or smell -cough -headache This list may  not describe all possible side effects. Call your doctor for medical advice about side effects. You may report side effects to FDA at 1-800-FDA-1088. Where should I keep my medicine? Keep out of the reach of children. Store at room temperature between 15 and 30 degrees C (59 and 86 degrees F). Avoid exposure to extreme heat, cold, or light. Throw away any unused medicine after the expiration date. NOTE: This sheet is a summary. It may not cover all possible information. If you have questions about this medicine, talk to your doctor, pharmacist, or health care provider.  2019 Elsevier/Gold Standard (2017-12-16 14:10:08)    Montelukast oral tablets What is this medicine? MONTELUKAST (mon te LOO kast) is used to prevent and treat the symptoms of asthma. It is also used to treat allergies. Do not use for an acute asthma attack. This medicine may be used for other purposes; ask your health care provider or pharmacist if you have questions. COMMON BRAND NAME(S): Singulair What should I tell my health care provider before I take this medicine? They need to know if you have any of these conditions: -liver disease -an unusual or allergic reaction to montelukast, other medicines, foods, dyes, or preservatives -pregnant or trying to get pregnant -breast-feeding How should I use this medicine? This medicine should be given by mouth. Follow the directions on the prescription label. Take this medicine at the same time every day. You may take this medicine with or without meals. Do not chew the tablets. Do not stop taking your medicine unless your doctor tells you to. Talk to your pediatrician regarding the use of this medicine in children. Special care may be needed. While this drug may be prescribed for children as young as 93 years of age for selected conditions, precautions do apply. Overdosage: If you think you have taken too much of this medicine contact a poison control center or emergency room at  once. NOTE: This medicine is only for you. Do not share this medicine with others. What if I miss a dose? If you miss a dose, take it as soon as you can. If it is almost time for your next dose, take only that dose. Do not take double or extra doses. What may interact with this medicine? -anti-infectives like rifampin and rifabutin -medicines for seizures like phenytoin, phenobarbital, and carbamazepine This list may not describe all possible interactions. Give your health care provider a list of all the medicines, herbs, non-prescription drugs, or dietary supplements you use. Also tell them if you smoke, drink alcohol, or use illegal drugs. Some items may interact with your medicine. What should I watch for while using this medicine? Visit your doctor or health care professional for regular checks on your progress. Tell your doctor or health care professional if your allergy or asthma symptoms do not improve. Take your medicine even when you do not have symptoms. Do not stop taking any of your medicine(s) unless your doctor tells you to. If you have asthma, talk to your doctor about what to do in an acute asthma attack. Always have your inhaled rescue medicine for asthma attacks with you. Patients and their families should watch for new or worsening thoughts of suicide or depression. Also watch for sudden changes in feelings such as feeling  anxious, agitated, panicky, irritable, hostile, aggressive, impulsive, severely restless, overly excited and hyperactive, or not being able to sleep. Any worsening of mood or thoughts of suicide or dying should be reported to your health care professional right away. What side effects may I notice from receiving this medicine? Side effects that you should report to your doctor or health care professional as soon as possible: -allergic reactions like skin rash or hives, or swelling of the face, lips, or tongue -breathing problems -changes in emotions or  moods -confusion -depressed mood -fever or infection -hallucinations -joint pain -painful lumps under the skin -pain, tingling, numbness in the hands or feet -redness, blistering, peeling, or loosening of the skin, including inside the mouth -restlessness -seizures -sleep walking -signs and symptoms of infection like fever; chills; cough; sore throat; flu-like illness -signs and symptoms of liver injury like dark yellow or brown urine; general ill feeling or flu-like symptoms; light-colored stools; loss of appetite; nausea; right upper belly pain; unusually weak or tired; yellowing of the eyes or skin -sinus pain or swelling -stuttering -suicidal thoughts or other mood changes -tremors -trouble sleeping -uncontrolled muscle movements -unusual bleeding or bruising -vivid or bad dreams Side effects that usually do not require medical attention (report to your doctor or health care professional if they continue or are bothersome): -dizziness -drowsiness -headache -runny nose -stomach upset -tiredness This list may not describe all possible side effects. Call your doctor for medical advice about side effects. You may report side effects to FDA at 1-800-FDA-1088. Where should I keep my medicine? Keep out of the reach of children. Store at room temperature between 15 and 30 degrees C (59 and 86 degrees F). Protect from light and moisture. Keep this medicine in the original bottle. Throw away any unused medicine after the expiration date. NOTE: This sheet is a summary. It may not cover all possible information. If you have questions about this medicine, talk to your doctor, pharmacist, or health care provider.  2019 Elsevier/Gold Standard (2018-07-19 13:51:04)    Cetirizine tablets What is this medicine? CETIRIZINE (se TI ra zeen) is an antihistamine. This medicine is used to treat or prevent symptoms of allergies. It is also used to help reduce itchy skin rash and hives. This  medicine may be used for other purposes; ask your health care provider or pharmacist if you have questions. COMMON BRAND NAME(S): All Day Allergy, Allergy Relief, Zyrtec, Zyrtec Hives Relief What should I tell my health care provider before I take this medicine? They need to know if you have any of these conditions: -kidney disease -liver disease -an unusual or allergic reaction to cetirizine, hydroxyzine, other medicines, foods, dyes, or preservatives -pregnant or trying to get pregnant -breast-feeding How should I use this medicine? Take this medicine by mouth with a glass of water. Follow the directions on the prescription label. You can take this medicine with food or on an empty stomach. Take your medicine at regular times. Do not take more often than directed. You may need to take this medicine for several days before your symptoms improve. Talk to your pediatrician regarding the use of this medicine in children. Special care may be needed. While this drug may be prescribed for children as young as 91 years of age for selected conditions, precautions do apply. Overdosage: If you think you have taken too much of this medicine contact a poison control center or emergency room at once. NOTE: This medicine is only for you. Do not share this medicine  with others. What if I miss a dose? If you miss a dose, take it as soon as you can. If it is almost time for your next dose, take only that dose. Do not take double or extra doses. What may interact with this medicine? -alcohol -certain medicines for anxiety or sleep -narcotic medicines for pain -other medicines for colds or allergies This list may not describe all possible interactions. Give your health care provider a list of all the medicines, herbs, non-prescription drugs, or dietary supplements you use. Also tell them if you smoke, drink alcohol, or use illegal drugs. Some items may interact with your medicine. What should I watch for while  using this medicine? Visit your doctor or health care professional for regular checks on your health. Tell your doctor if your symptoms do not improve. You may get drowsy or dizzy. Do not drive, use machinery, or do anything that needs mental alertness until you know how this medicine affects you. Do not stand or sit up quickly, especially if you are an older patient. This reduces the risk of dizzy or fainting spells. Your mouth may get dry. Chewing sugarless gum or sucking hard candy, and drinking plenty of water may help. Contact your doctor if the problem does not go away or is severe. What side effects may I notice from receiving this medicine? Side effects that you should report to your doctor or health care professional as soon as possible: -allergic reactions like skin rash, itching or hives, swelling of the face, lips, or tongue -changes in vision or hearing -fast or irregular heartbeat -trouble passing urine or change in the amount of urine Side effects that usually do not require medical attention (report to your doctor or health care professional if they continue or are bothersome): -dizziness -dry mouth -irritability -sore throat -stomach pain -tiredness This list may not describe all possible side effects. Call your doctor for medical advice about side effects. You may report side effects to FDA at 1-800-FDA-1088. Where should I keep my medicine? Keep out of the reach of children. Store at room temperature between 15 and 30 degrees C (59 and 86 degrees F). Throw away any unused medicine after the expiration date. NOTE: This sheet is a summary. It may not cover all possible information. If you have questions about this medicine, talk to your doctor, pharmacist, or health care provider.  2019 Elsevier/Gold Standard (2014-12-18 13:44:42)     Allergies, Adult An allergy means that your body reacts to something that bothers it (allergen). It is not a normal reaction. This can  happen from something that you:  Eat.  Breathe in.  Touch. You can have an allergy (be allergic) to:  Outdoor things, like: ? Pollen. ? Grass. ? Weeds.  Indoor things, like: ? Dust. ? Smoke. ? Pet dander.  Foods.  Medicines.  Things that bother your skin, like: ? Detergents. ? Chemicals. ? Latex.  Perfume.  Bugs. An allergy cannot spread from person to person (is not contagious). Follow these instructions at home:         Stay away from things that you know you are allergic to.  If you have allergies to things in the air, wash out your nose each day. Do it with one of these: ? A salt-water (saline) spray. ? A container (neti pot).  Take over-the-counter and prescription medicines only as told by your doctor.  Keep all follow-up visits as told by your doctor. This is important.  If you are  at risk for a very bad allergy reaction (anaphylaxis), keep an auto-injector with you all the time. This is called an epinephrine injection. ? This is pre-measured medicine with a needle. You can put it into your skin by yourself. ? Right after you have a very bad allergy reaction, you or a person with you must give the medicine in less than a few minutes. This is an emergency.  If you have ever had a very bad allergy reaction, wear a medical alert bracelet or necklace. Your very bad allergy should be written on it. Contact a health care provider if:  Your symptoms do not get better with treatment. Get help right away if:  You have symptoms of a very bad allergy reaction. These include: ? A swollen mouth, tongue, or throat. ? Pain or tightness in your chest. ? Trouble breathing. ? Being short of breath. ? Dizziness. ? Fainting. ? Very bad pain in your belly (abdomen). ? Throwing up (vomiting). ? Watery poop (diarrhea). Summary  An allergy means that your body reacts to something that bothers it (allergen). It is not a normal reaction.  Stay away from things  that make your body react.  Take over-the-counter and prescription medicines only as told by your doctor.  If you are at risk for a very bad allergy reaction, carry an auto-injector (epinephrine injection) all the time. Also, wear a medical alert bracelet or necklace so people know about your allergy. This information is not intended to replace advice given to you by your health care provider. Make sure you discuss any questions you have with your health care provider. Document Released: 03/20/2013 Document Revised: 03/08/2017 Document Reviewed: 03/08/2017 Elsevier Interactive Patient Education  2019 [employer] Inc.     Albuterol inhalation solution What is this medicine? ALBUTEROL (al Gaspar Bidding) is a bronchodilator. It helps to open up the airways in your lungs to make it easier to breathe. This medicine is used to treat and to prevent bronchospasm. This medicine may be used for other purposes; ask your health care provider or pharmacist if you have questions. COMMON BRAND NAME(S): Accuneb, Proventil What should I tell my health care provider before I take this medicine? They need to know if you have any of the following conditions: -diabetes -heart disease or irregular heartbeat -high blood pressure -pheochromocytoma -seizures -thyroid disease -an unusual or allergic reaction to albuterol, levalbuterol, sulfites, other medicines, foods, dyes, or preservatives -pregnant or trying to get pregnant -breast-feeding How should I use this medicine? This medicine is used in a nebulizer. Nebulizers make a liquid into an aerosol that you breathe in through your mouth or your mouth and nose into your lungs. You will be taught how to use your nebulizer. Follow the directions on your prescription label. Take your medicine at regular intervals. Do not use more often than directed. Talk to your pediatrician regarding the use of this medicine in children. Special care may be needed. Overdosage: If  you think you have taken too much of this medicine contact a poison control center or emergency room at once. NOTE: This medicine is only for you. Do not share this medicine with others. What if I miss a dose? If you miss a dose, use it as soon as you can. If it is almost time for your next dose, use only that dose. Do not use double or extra doses. What may interact with this medicine? -anti-infectives like chloroquine and pentamidine -caffeine -cisapride -diuretics -medicines for colds -medicines for depression  or emotional or psychotic conditions -medicines for weight loss including some herbal products -methadone -some antibiotics like clarithromycin, erythromycin, levofloxacin, and linezolid -some heart medicines -steroid hormones like dexamethasone, cortisone, hydrocortisone -theophylline -thyroid hormones This list may not describe all possible interactions. Give your health care provider a list of all the medicines, herbs, non-prescription drugs, or dietary supplements you use. Also tell them if you smoke, drink alcohol, or use illegal drugs. Some items may interact with your medicine. What should I watch for while using this medicine? Tell your doctor or health care professional if your symptoms do not improve. Do not use extra albuterol. Call your doctor right away if your asthma or bronchitis gets worse while you are using this medicine. If your mouth gets dry try chewing sugarless gum or sucking hard candy. Drink water as directed. What side effects may I notice from receiving this medicine? Side effects that you should report to your doctor or health care professional as soon as possible: -allergic reactions like skin rash, itching or hives, swelling of the face, lips, or tongue -breathing problems -chest pain -feeling faint or lightheaded, falls -high blood pressure -irregular heartbeat -fever -muscle cramps or weakness -pain, tingling, numbness in the hands or  feet -vomiting Side effects that usually do not require medical attention (report to your doctor or health care professional if they continue or are bothersome): -cough -difficulty sleeping -headache -nervousness, trembling -stomach upset -stuffy or runny nose -throat irritation -unusual taste This list may not describe all possible side effects. Call your doctor for medical advice about side effects. You may report side effects to FDA at 1-800-FDA-1088. Where should I keep my medicine? Keep out of the reach of children. Store between 2 and 25 degrees C (36 and 77 degrees F). Do not freeze. Protect from light. Throw away any unused medicine after the expiration date. Most products are kept in the foil package until time of use. Some products can be used up to 1 week after they are removed from the foil pouch. Check the instructions that come with your medicine. NOTE: This sheet is a summary. It may not cover all possible information. If you have questions about this medicine, talk to your doctor, pharmacist, or health care provider.  2019 Elsevier/Gold Standard (2011-08-14 15:19:55)      Albuterol inhalation aerosol What is this medicine? ALBUTEROL (al Gaspar Bidding) is a bronchodilator. It helps open up the airways in your lungs to make it easier to breathe. This medicine is used to treat and to prevent bronchospasm. This medicine may be used for other purposes; ask your health care provider or pharmacist if you have questions. COMMON BRAND NAME(S): Proair HFA, Proventil, Proventil HFA, Respirol, Ventolin, Ventolin HFA What should I tell my health care provider before I take this medicine? They need to know if you have any of the following conditions: -diabetes -heart disease or irregular heartbeat -high blood pressure -pheochromocytoma -seizures -thyroid disease -an unusual or allergic reaction to albuterol, levalbuterol, sulfites, other medicines, foods, dyes, or  preservatives -pregnant or trying to get pregnant -breast-feeding How should I use this medicine? This medicine is for inhalation through the mouth. Follow the directions on your prescription label. Take your medicine at regular intervals. Do not use more often than directed. Make sure that you are using your inhaler correctly. Ask you doctor or health care provider if you have any questions. Talk to your pediatrician regarding the use of this medicine in children. Special care may be needed.  Overdosage: If you think you have taken too much of this medicine contact a poison control center or emergency room at once. NOTE: This medicine is only for you. Do not share this medicine with others. What if I miss a dose? If you miss a dose, use it as soon as you can. If it is almost time for your next dose, use only that dose. Do not use double or extra doses. What may interact with this medicine? -anti-infectives like chloroquine and pentamidine -caffeine -cisapride -diuretics -medicines for colds -medicines for depression or for emotional or psychotic conditions -medicines for weight loss including some herbal products -methadone -some antibiotics like clarithromycin, erythromycin, levofloxacin, and linezolid -some heart medicines -steroid hormones like dexamethasone, cortisone, hydrocortisone -theophylline -thyroid hormones This list may not describe all possible interactions. Give your health care provider a list of all the medicines, herbs, non-prescription drugs, or dietary supplements you use. Also tell them if you smoke, drink alcohol, or use illegal drugs. Some items may interact with your medicine. What should I watch for while using this medicine? Tell your doctor or health care professional if your symptoms do not improve. Do not use extra albuterol. If your asthma or bronchitis gets worse while you are using this medicine, call your doctor right away. If your mouth gets dry try  chewing sugarless gum or sucking hard candy. Drink water as directed. What side effects may I notice from receiving this medicine? Side effects that you should report to your doctor or health care professional as soon as possible: -allergic reactions like skin rash, itching or hives, swelling of the face, lips, or tongue -breathing problems -chest pain -feeling faint or lightheaded, falls -high blood pressure -irregular heartbeat -fever -muscle cramps or weakness -pain, tingling, numbness in the hands or feet -vomiting Side effects that usually do not require medical attention (report to your doctor or health care professional if they continue or are bothersome): -cough -difficulty sleeping -headache -nervousness or trembling -stomach upset -stuffy or runny nose -throat irritation -unusual taste This list may not describe all possible side effects. Call your doctor for medical advice about side effects. You may report side effects to FDA at 1-800-FDA-1088. Where should I keep my medicine? Keep out of the reach of children. Store at room temperature between 15 and 30 degrees C (59 and 86 degrees F). The contents are under pressure and may burst when exposed to heat or flame. Do not freeze. This medicine does not work as well if it is too cold. Throw away any unused medicine after the expiration date. Inhalers need to be thrown away after the labeled number of puffs have been used or by the expiration date; whichever comes first. Ventolin HFA should be thrown away 12 months after removing from foil pouch. Check the instructions that come with your medicine. NOTE: This sheet is a summary. It may not cover all possible information. If you have questions about this medicine, talk to your doctor, pharmacist, or health care provider.  2019 Elsevier/Gold Standard (2013-05-11 10:57:17)     Asthma, Adult  Asthma is a long-term (chronic) condition in which the airways get tight and narrow.  The airways are the breathing passages that lead from the nose and mouth down into the lungs. A person with asthma will have times when symptoms get worse. These are called asthma attacks. They can cause coughing, whistling sounds when you breathe (wheezing), shortness of breath, and chest pain. They can make it hard to breathe. There is  no cure for asthma, but medicines and lifestyle changes can help control it. There are many things that can bring on an asthma attack or make asthma symptoms worse (triggers). Common triggers include:  Mold.  Dust.  Cigarette smoke.  Cockroaches.  Things that can cause allergy symptoms (allergens). These include animal skin flakes (dander) and pollen from trees or grass.  Things that pollute the air. These may include household cleaners, wood smoke, smog, or chemical odors.  Cold air, weather changes, and wind.  Crying or laughing hard.  Stress.  Certain medicines or drugs.  Certain foods such as dried fruit, potato chips, and grape juice.  Infections, such as a cold or the flu.  Certain medical conditions or diseases.  Exercise or tiring activities. Asthma may be treated with medicines and by staying away from the things that cause asthma attacks. Types of medicines may include:  Controller medicines. These help prevent asthma symptoms. They are usually taken every day.  Fast-acting reliever or rescue medicines. These quickly relieve asthma symptoms. They are used as needed and provide short-term relief.  Allergy medicines if your attacks are brought on by allergens.  Medicines to help control the body's defense (immune) system. Follow these instructions at home: Avoiding triggers in your home  Change your heating and air conditioning filter often.  Limit your use of fireplaces and wood stoves.  Get rid of pests (such as roaches and mice) and their droppings.  Throw away plants if you see mold on them.  Clean your floors. Dust  regularly. Use cleaning products that do not smell.  Have someone vacuum when you are not home. Use a vacuum cleaner with a HEPA filter if possible.  Replace carpet with wood, tile, or vinyl flooring. Carpet can trap animal skin flakes and dust.  Use allergy-proof pillows, mattress covers, and box spring covers.  Wash bed sheets and blankets every week in hot water. Dry them in a dryer.  Keep your bedroom free of any triggers.  Avoid pets and keep windows closed when things that cause allergy symptoms are in the air.  Use blankets that are made of polyester or cotton.  Clean bathrooms and kitchens with bleach. If possible, have someone repaint the walls in these rooms with mold-resistant paint. Keep out of the rooms that are being cleaned and painted.  Wash your hands often with soap and water. If soap and water are not available, use hand sanitizer.  Do not allow anyone to smoke in your home. General instructions  Take over-the-counter and prescription medicines only as told by your doctor. ? Talk with your doctor if you have questions about how or when to take your medicines. ? Make note if you need to use your medicines more often than usual.  Do not use any products that contain nicotine or tobacco, such as cigarettes and e-cigarettes. If you need help quitting, ask your doctor.  Stay away from secondhand smoke.  Avoid doing things outdoors when allergen counts are high and when air quality is low.  Wear a ski mask when doing outdoor activities in the winter. The mask should cover your nose and mouth. Exercise indoors on cold days if you can.  Warm up before you exercise. Take time to cool down after exercise.  Use a peak flow meter as told by your doctor. A peak flow meter is a tool that measures how well the lungs are working.  Keep track of the peak flow meter's readings. Write them down.  Follow your asthma action plan. This is a written plan for taking care of your  asthma and treating your attacks.  Make sure you get all the shots (vaccines) that your doctor recommends. Ask your doctor about a flu shot and a pneumonia shot.  Keep all follow-up visits as told by your doctor. This is important. Contact a doctor if:  You have wheezing, shortness of breath, or a cough even while taking medicine to prevent attacks.  The mucus you cough up (sputum) is thicker than usual.  The mucus you cough up changes from clear or white to yellow, green, gray, or bloody.  You have problems from the medicine you are taking, such as: ? A rash. ? Itching. ? Swelling. ? Trouble breathing.  You need reliever medicines more than 2-3 times a week.  Your peak flow reading is still at 50-79% of your personal best after following the action plan for 1 hour.  You have a fever. Get help right away if:  You seem to be worse and are not responding to medicine during an asthma attack.  You are short of breath even at rest.  You get short of breath when doing very little activity.  You have trouble eating, drinking, or talking.  You have chest pain or tightness.  You have a fast heartbeat.  Your lips or fingernails start to turn blue.  You are light-headed or dizzy, or you faint.  Your peak flow is less than 50% of your personal best.  You feel too tired to breathe normally. Summary  Asthma is a long-term (chronic) condition in which the airways get tight and narrow. An asthma attack can make it hard to breathe.  Asthma cannot be cured, but medicines and lifestyle changes can help control it.  Make sure you understand how to avoid triggers and how and when to use your medicines. This information is not intended to replace advice given to you by your health care provider. Make sure you discuss any questions you have with your health care provider. Document Released: 05/11/2008 Document Revised: 12/28/2016 Document Reviewed: 12/28/2016 Elsevier Interactive  Patient Education  2019 Elsevier Inc.     Viral Respiratory Infection A viral respiratory infection is an illness that affects parts of the body that are used for breathing. These include the lungs, nose, and throat. It is caused by a germ called a virus. Some examples of this kind of infection are:  A cold.  The flu (influenza).  A respiratory syncytial virus (RSV) infection. A person who gets this illness may have the following symptoms:  A stuffy or runny nose.  Yellow or green fluid in the nose.  A cough.  Sneezing.  Tiredness (fatigue).  Achy muscles.  A sore throat.  Sweating or chills.  A fever.  A headache. Follow these instructions at home: Managing pain and congestion  Take over-the-counter and prescription medicines only as told by your doctor.  If you have a sore throat, gargle with salt water. Do this 3-4 times per day or as needed. To make a salt-water mixture, dissolve -1 tsp of salt in 1 cup of warm water. Make sure that all the salt dissolves.  Use nose drops made from salt water. This helps with stuffiness (congestion). It also helps soften the skin around your nose.  Drink enough fluid to keep your pee (urine) pale yellow. General instructions   Rest as much as possible.  Do not drink alcohol.  Do not use any products that  have nicotine or tobacco, such as cigarettes and e-cigarettes. If you need help quitting, ask your doctor.  Keep all follow-up visits as told by your doctor. This is important. How is this prevented?   Get a flu shot every year. Ask your doctor when you should get your flu shot.  Do not let other people get your germs. If you are sick: ? Stay home from work or school. ? Wash your hands with soap and water often. Wash your hands after you cough or sneeze. If soap and water are not available, use hand sanitizer.  Avoid contact with people who are sick during cold and flu season. This is in fall and winter. Get help  if:  Your symptoms last for 10 days or longer.  Your symptoms get worse over time.  You have a fever.  You have very bad pain in your face or forehead.  Parts of your jaw or neck become very swollen. Get help right away if:  You feel pain or pressure in your chest.  You have shortness of breath.  You faint or feel like you will faint.  You keep throwing up (vomiting).  You feel confused. Summary  A viral respiratory infection is an illness that affects parts of the body that are used for breathing.  Examples of this illness include a cold, the flu, and respiratory syncytial virus (RSV) infection.  The infection can cause a runny nose, cough, sneezing, sore throat, and fever.  Follow what your doctor tells you about taking medicines, drinking lots of fluid, washing your hands, resting at home, and avoiding people who are sick. This information is not intended to replace advice given to you by your health care provider. Make sure you discuss any questions you have with your health care provider. Document Released: 11/05/2008 Document Revised: 01/03/2018 Document Reviewed: 01/03/2018 Elsevier Interactive Patient Education  2019 [employer].
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
## 2019-03-07 NOTE — Addendum Note (Signed)
Addended by: Lisabeth Pick on: 03/07/2019 02:52 PM   Modules accepted: Orders

## 2019-03-16 ENCOUNTER — Ambulatory Visit
Admission: RE | Admit: 2019-03-16 | Discharge: 2019-03-16 | Disposition: A | Discharge status: Home / Self Care | Payer: Commercial Managed Care - PPO | Source: Ambulatory Visit | Attending: Family Medicine | Admitting: Family Medicine

## 2019-03-16 ENCOUNTER — Other Ambulatory Visit: Payer: Self-pay

## 2019-03-16 DIAGNOSIS — N644 Mastodynia: Secondary | ICD-10-CM

## 2019-03-16 DIAGNOSIS — N6002 Solitary cyst of left breast: Secondary | ICD-10-CM

## 2019-03-16 DIAGNOSIS — Z803 Family history of malignant neoplasm of breast: Secondary | ICD-10-CM

## 2019-03-30 ENCOUNTER — Telehealth: Payer: Self-pay | Admitting: Family Medicine

## 2019-03-30 NOTE — Telephone Encounter (Signed)
Message left for patient to contact office to review results of Mammogram.

## 2019-06-06 ENCOUNTER — Ambulatory Visit: Payer: Self-pay | Admitting: Family Medicine

## 2019-09-28 ENCOUNTER — Other Ambulatory Visit: Payer: Self-pay | Admitting: Internal Medicine

## 2019-09-28 ENCOUNTER — Emergency Department (HOSPITAL_BASED_OUTPATIENT_CLINIC_OR_DEPARTMENT_OTHER)
Admission: EM | Admit: 2019-09-28 | Discharge: 2019-09-28 | Disposition: A | Discharge status: Home / Self Care | Payer: Self-pay | Attending: Emergency Medicine | Admitting: Emergency Medicine

## 2019-09-28 ENCOUNTER — Encounter (HOSPITAL_BASED_OUTPATIENT_CLINIC_OR_DEPARTMENT_OTHER): Payer: Self-pay

## 2019-09-28 ENCOUNTER — Other Ambulatory Visit: Payer: Self-pay

## 2019-09-28 DIAGNOSIS — Z20828 Contact with and (suspected) exposure to other viral communicable diseases: Secondary | ICD-10-CM | POA: Insufficient documentation

## 2019-09-28 DIAGNOSIS — N611 Abscess of the breast and nipple: Secondary | ICD-10-CM | POA: Insufficient documentation

## 2019-09-28 DIAGNOSIS — J45909 Unspecified asthma, uncomplicated: Secondary | ICD-10-CM | POA: Insufficient documentation

## 2019-09-28 DIAGNOSIS — Z881 Allergy status to other antibiotic agents status: Secondary | ICD-10-CM | POA: Insufficient documentation

## 2019-09-28 DIAGNOSIS — Z79899 Other long term (current) drug therapy: Secondary | ICD-10-CM | POA: Insufficient documentation

## 2019-09-28 DIAGNOSIS — Z886 Allergy status to analgesic agent status: Secondary | ICD-10-CM | POA: Insufficient documentation

## 2019-09-28 LAB — SARS CORONAVIRUS 2 (TAT 6-24 HRS): SARS Coronavirus 2: NEGATIVE

## 2019-09-28 LAB — PREGNANCY, URINE: Preg Test, Ur: NEGATIVE

## 2019-09-28 MED ORDER — HYDROCODONE-ACETAMINOPHEN 5-325 MG PO TABS: 1.0000 | ORAL_TABLET | ORAL | 0 refills | Status: DC | PRN

## 2019-09-28 MED ORDER — DOXYCYCLINE HYCLATE 100 MG PO CAPS: 100.0000 mg | ORAL_CAPSULE | Freq: Two times a day (BID) | ORAL | 0 refills | Status: AC

## 2019-09-28 MED FILL — HYDROCODON-APAP 5-325: 5-325 | 2 days supply | Qty: 8 | Fill #0

## 2019-09-28 MED FILL — DOXYCYCLINE HYCLATE 100 MG: 100 | 7 days supply | Qty: 14 | Fill #0

## 2019-09-28 NOTE — ED Triage Notes (Signed)
Pt states first noticed a "knot" on right breast, one week ago, increasingly sore and red. Some drainage.
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
## 2019-10-05 ENCOUNTER — Inpatient Hospital Stay: Admission: RE | Admit: 2019-10-05 | Payer: Self-pay | Source: Ambulatory Visit

## 2019-10-13 ENCOUNTER — Ambulatory Visit: Payer: Self-pay | Admitting: Family Medicine

## 2019-11-01 ENCOUNTER — Other Ambulatory Visit: Payer: Self-pay

## 2019-11-01 ENCOUNTER — Encounter (HOSPITAL_BASED_OUTPATIENT_CLINIC_OR_DEPARTMENT_OTHER): Payer: Self-pay

## 2019-11-01 ENCOUNTER — Emergency Department (HOSPITAL_BASED_OUTPATIENT_CLINIC_OR_DEPARTMENT_OTHER): Payer: Self-pay

## 2019-11-01 ENCOUNTER — Emergency Department (HOSPITAL_BASED_OUTPATIENT_CLINIC_OR_DEPARTMENT_OTHER)
Admission: EM | Admit: 2019-11-01 | Discharge: 2019-11-01 | Disposition: A | Discharge status: Home / Self Care | Payer: Self-pay | Attending: Emergency Medicine | Admitting: Emergency Medicine

## 2019-11-01 DIAGNOSIS — R0602 Shortness of breath: Secondary | ICD-10-CM | POA: Insufficient documentation

## 2019-11-01 DIAGNOSIS — Z79899 Other long term (current) drug therapy: Secondary | ICD-10-CM | POA: Insufficient documentation

## 2019-11-01 DIAGNOSIS — Z20822 Contact with and (suspected) exposure to covid-19: Secondary | ICD-10-CM

## 2019-11-01 DIAGNOSIS — Z20828 Contact with and (suspected) exposure to other viral communicable diseases: Secondary | ICD-10-CM | POA: Insufficient documentation

## 2019-11-01 LAB — SARS CORONAVIRUS 2 AG (30 MIN TAT): SARS Coronavirus 2 Ag: NEGATIVE

## 2019-11-01 NOTE — ED Triage Notes (Signed)
Pt reports SOB with some chills, states that she does have history of Asthma and has been using home inhalers without a lot of relief.

## 2019-11-01 NOTE — Discharge Instructions (Signed)
You were seen in the emergency department for shortness of breath.  Your chest x-ray did not show any signs of pneumonia.  You had a rapid Covid test that was negative and your send out Covid test is pending at time of discharge.  You should isolate until your Covid testing is resulted.  Return to the emergency department if any worsening symptoms.
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
## 2019-11-01 NOTE — ED Triage Notes (Signed)
Pt also reports feeling of "2-5 lb weight" on her chest

## 2019-11-03 LAB — NOVEL CORONAVIRUS, NAA (HOSP ORDER, SEND-OUT TO REF LAB; TAT 18-24 HRS): SARS-CoV-2, NAA: NOT DETECTED

## 2019-11-27 ENCOUNTER — Ambulatory Visit: Payer: Self-pay | Admitting: Family Medicine

## 2020-02-01 ENCOUNTER — Other Ambulatory Visit: Payer: Self-pay

## 2020-02-01 ENCOUNTER — Encounter (HOSPITAL_BASED_OUTPATIENT_CLINIC_OR_DEPARTMENT_OTHER): Payer: Self-pay | Admitting: *Deleted

## 2020-02-01 ENCOUNTER — Emergency Department (HOSPITAL_BASED_OUTPATIENT_CLINIC_OR_DEPARTMENT_OTHER)
Admission: EM | Admit: 2020-02-01 | Discharge: 2020-02-01 | Disposition: A | Discharge status: Home / Self Care | Payer: Self-pay | Attending: Emergency Medicine | Admitting: Emergency Medicine

## 2020-02-01 DIAGNOSIS — R110 Nausea: Secondary | ICD-10-CM | POA: Insufficient documentation

## 2020-02-01 DIAGNOSIS — R519 Headache, unspecified: Secondary | ICD-10-CM | POA: Insufficient documentation

## 2020-02-01 DIAGNOSIS — J029 Acute pharyngitis, unspecified: Secondary | ICD-10-CM | POA: Insufficient documentation

## 2020-02-01 DIAGNOSIS — R6883 Chills (without fever): Secondary | ICD-10-CM | POA: Insufficient documentation

## 2020-02-01 DIAGNOSIS — Z1152 Encounter for screening for COVID-19: Secondary | ICD-10-CM

## 2020-02-01 DIAGNOSIS — Z20822 Contact with and (suspected) exposure to covid-19: Secondary | ICD-10-CM | POA: Insufficient documentation

## 2020-02-01 DIAGNOSIS — J45909 Unspecified asthma, uncomplicated: Secondary | ICD-10-CM | POA: Insufficient documentation

## 2020-02-01 DIAGNOSIS — R61 Generalized hyperhidrosis: Secondary | ICD-10-CM | POA: Insufficient documentation

## 2020-02-01 LAB — SARS CORONAVIRUS 2 (TAT 6-24 HRS): SARS Coronavirus 2: NEGATIVE

## 2020-02-01 MED ORDER — IBUPROFEN 400 MG PO TABS
400.0000 mg | ORAL_TABLET | Freq: Once | ORAL | Status: AC
  Administered 2020-02-01: 400 mg via ORAL
  Filled 2020-02-01: qty 1

## 2020-02-01 NOTE — ED Triage Notes (Signed)
Intermittent headache for 2 weeks.
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
## 2020-02-01 NOTE — Discharge Instructions (Signed)
Please rest and drink plenty of fluids Take Ibuprofen or Naproxen for headache Please quarantine until you get the results of your test which could take up to 3 days Return if you are worsening

## 2020-02-13 ENCOUNTER — Telehealth: Payer: Self-pay | Admitting: Family Medicine

## 2020-02-13 NOTE — Telephone Encounter (Signed)
Pt was called and reminded of there appointment 

## 2020-02-14 ENCOUNTER — Ambulatory Visit (INDEPENDENT_AMBULATORY_CARE_PROVIDER_SITE_OTHER): Payer: Self-pay | Admitting: Family Medicine

## 2020-02-14 ENCOUNTER — Other Ambulatory Visit: Payer: Self-pay

## 2020-02-14 ENCOUNTER — Encounter: Payer: Self-pay | Admitting: Family Medicine

## 2020-02-14 VITALS — BP 112/77 | HR 72 | Temp 98.6°F | Ht 63.0 in | Wt 121.8 lb

## 2020-02-14 DIAGNOSIS — J45909 Unspecified asthma, uncomplicated: Secondary | ICD-10-CM

## 2020-02-14 DIAGNOSIS — J302 Other seasonal allergic rhinitis: Secondary | ICD-10-CM

## 2020-02-14 DIAGNOSIS — R519 Headache, unspecified: Secondary | ICD-10-CM

## 2020-02-14 DIAGNOSIS — Z09 Encounter for follow-up examination after completed treatment for conditions other than malignant neoplasm: Secondary | ICD-10-CM

## 2020-02-14 LAB — POCT URINALYSIS DIPSTICK
Bilirubin, UA: NEGATIVE
Blood, UA: NEGATIVE
Glucose, UA: NEGATIVE
Ketones, UA: NEGATIVE
Leukocytes, UA: NEGATIVE
Nitrite, UA: NEGATIVE
Protein, UA: POSITIVE — AB
Spec Grav, UA: >=1.03 — AB (ref 1.010–1.025)
Urobilinogen, UA: 1 E.U./dL
pH, UA: 6 (ref 5.0–8.0)

## 2020-02-14 MED ORDER — CETIRIZINE HCL 10 MG PO TABS: 10.0000 mg | ORAL_TABLET | Freq: Every day | ORAL | 11 refills | Status: AC

## 2020-02-14 MED ORDER — IBUPROFEN 800 MG PO TABS: 800.0000 mg | ORAL_TABLET | Freq: Three times a day (TID) | ORAL | 3 refills | Status: DC | PRN

## 2020-02-14 MED ORDER — MONTELUKAST SODIUM 10 MG PO TABS: 10.0000 mg | ORAL_TABLET | Freq: Every day | ORAL | 11 refills | Status: AC

## 2020-02-14 NOTE — Patient Instructions (Signed)
General Headache Without Cause A headache is pain or discomfort that is felt around the head or neck area. There are many causes and types of headaches. In some cases, the cause may not be found. Follow these instructions at home: Watch your condition for any changes. Let your doctor know about them. Take these steps to help with your condition: Managing pain      Take over-the-counter and prescription medicines only as told by your doctor.  Lie down in a dark, quiet room when you have a headache.  If told, put ice on your head and neck area: ? Put ice in a plastic bag. ? Place a towel between your skin and the bag. ? Leave the ice on for 20 minutes, 2-3 times per day.  If told, put heat on the affected area. Use the heat source that your doctor recommends, such as a moist heat pack or a heating pad. ? Place a towel between your skin and the heat source. ? Leave the heat on for 20-30 minutes. ? Remove the heat if your skin turns bright red. This is very important if you are unable to feel pain, heat, or cold. You may have a greater risk of getting burned.  Keep lights dim if bright lights bother you or make your headaches worse. Eating and drinking  Eat meals on a regular schedule.  If you drink alcohol: ? Limit how much you use to:  0-1 drink a day for women.  0-2 drinks a day for men. ? Be aware of how much alcohol is in your drink. In the [place] one drink equals one 12 oz bottle of beer (355 mL), one 5 oz glass of wine (148 mL), or one 1 oz glass of hard liquor (44 mL).  Stop drinking caffeine, or reduce how much caffeine you drink. General instructions   Keep a journal to find out if certain things bring on headaches. For example, write down: ? What you eat and drink. ? How much sleep you get. ? Any change to your diet or medicines.  Get a massage or try other ways to relax.  Limit stress.  Sit up straight. Do not tighten (tense) your muscles.  Do not use any  products that contain nicotine or tobacco. This includes cigarettes, e-cigarettes, and chewing tobacco. If you need help quitting, ask your doctor.  Exercise regularly as told by your doctor.  Get enough sleep. This often means 7-9 hours of sleep each night.  Keep all follow-up visits as told by your doctor. This is important. Contact a doctor if:  Your symptoms are not helped by medicine.  You have a headache that feels different than the other headaches.  You feel sick to your stomach (nauseous) or you throw up (vomit).  You have a fever. Get help right away if:  Your headache gets very bad quickly.  Your headache gets worse after a lot of physical activity.  You keep throwing up.  You have a stiff neck.  You have trouble seeing.  You have trouble speaking.  You have pain in the eye or ear.  Your muscles are weak or you lose muscle control.  You lose your balance or have trouble walking.  You feel like you will pass out (faint) or you pass out.  You are mixed up (confused).  You have a seizure. Summary  A headache is pain or discomfort that is felt around the head or neck area.  There are many causes and   types of headaches. In some cases, the cause may not be found.  Keep a journal to help find out what causes your headaches. Watch your condition for any changes. Let your doctor know about them.  Contact a doctor if you have a headache that is different from usual, or if your headache is not helped by medicine.  Get help right away if your headache gets very bad, you throw up, you have trouble seeing, you lose your balance, or you have a seizure. This information is not intended to replace advice given to you by your health care provider. Make sure you discuss any questions you have with your health care provider. Document Revised: 06/13/2018 Document Reviewed: 06/13/2018 Elsevier Patient Education  2020 Else[employer] Inc. Ibuprofen tablets and capsules What is  this medicine? IBUPROFEN (eye BYOO proe fen) is a non-steroidal anti-inflammatory drug (NSAID). It is used for dental pain, fever, headaches or migraines, osteoarthritis, rheumatoid arthritis, or painful monthly periods. It can also relieve minor aches and pains caused by a cold, flu, or sore throat. This medicine may be used for other purposes; ask your health care provider or pharmacist if you have questions. COMMON BRAND NAME(S): Advil, Advil Junior Strength, Advil Migraine, Genpril, Ibren, IBU, Ibupak, Midol, Midol Cramps and Body Aches, Motrin, Motrin IB, Motrin Junior Strength, Motrin Migraine Pain, Samson-8, Toxicology Saliva Collection What should I tell my health care provider before I take this medicine? They need to know if you have any of these conditions:  cigarette smoker  coronary artery bypass graft (CABG) surgery within the past 2 weeks  drink more than 3 alcohol-containing drinks a day  heart disease  high blood pressure  history of stomach bleeding  kidney disease  liver disease  lung or breathing disease, like asthma  an unusual or allergic reaction to ibuprofen, aspirin, other NSAIDs, other medicines, foods, dyes, or preservatives  pregnant or trying to get pregnant  breast-feeding How should I use this medicine? Take this medicine by mouth with a glass of water. Follow the directions on the prescription label. Take this medicine with food if your stomach gets upset. Try to not lie down for at least 10 minutes after you take the medicine. Take your medicine at regular intervals. Do not take your medicine more often than directed. A special MedGuide will be given to you by the pharmacist with each prescription and refill. Be sure to read this information carefully each time. Talk to your pediatrician regarding the use of this medicine in children. Special care may be needed. Overdosage: If you think you have taken too much of this medicine contact a poison  control center or emergency room at once. NOTE: This medicine is only for you. Do not share this medicine with others. What if I miss a dose? If you miss a dose, take it as soon as you can. If it is almost time for your next dose, take only that dose. Do not take double or extra doses. What may interact with this medicine? Do not take this medicine with any of the following medications:  cidofovir  ketorolac  methotrexate  pemetrexed This medicine may also interact with the following medications:  alcohol  aspirin  diuretics  lithium  other drugs for inflammation like prednisone  warfarin This list may not describe all possible interactions. Give your health care provider a list of all the medicines, herbs, non-prescription drugs, or dietary supplements you use. Also tell them if you smoke, drink alcohol, or use illegal  drugs. Some items may interact with your medicine. What should I watch for while using this medicine? Tell your doctor or healthcare provider if your symptoms do not start to get better or if they get worse. This medicine may cause serious skin reactions. They can happen weeks to months after starting the medicine. Contact your healthcare provider right away if you notice fevers or flu-like symptoms with a rash. The rash may be red or purple and then turn into blisters or peeling of the skin. Or, you might notice a red rash with swelling of the face, lips or lymph nodes in your neck or under your arms. This medicine does not prevent heart attack or stroke. In fact, this medicine may increase the chance of a heart attack or stroke. The chance may increase with longer use of this medicine and in people who have heart disease. If you take aspirin to prevent heart attack or stroke, talk with your doctor or healthcare provider. Do not take other medicines that contain aspirin, ibuprofen, or naproxen with this medicine. Side effects such as stomach upset, nausea, or ulcers  may be more likely to occur. Many medicines available without a prescription should not be taken with this medicine. This medicine can cause ulcers and bleeding in the stomach and intestines at any time during treatment. Ulcers and bleeding can happen without warning symptoms and can cause death. To reduce your risk, do not smoke cigarettes or drink alcohol while you are taking this medicine. You may get drowsy or dizzy. Do not drive, use machinery, or do anything that needs mental alertness until you know how this medicine affects you. Do not stand or sit up quickly, especially if you are an older patient. This reduces the risk of dizzy or fainting spells. This medicine can cause you to bleed more easily. Try to avoid damage to your teeth and gums when you brush or floss your teeth. This medicine may be used to treat migraines. If you take migraine medicines for 10 or more days a month, your migraines may get worse. Keep a diary of headache days and medicine use. Contact your healthcare provider if your migraine attacks occur more frequently. What side effects may I notice from receiving this medicine? Side effects that you should report to your doctor or health care professional as soon as possible:  allergic reactions like skin rash, itching or hives, swelling of the face, lips, or tongue  redness, blistering, peeling or loosening of the skin, including inside the mouth  severe stomach pain  signs and symptoms of bleeding such as bloody or black, tarry stools; red or dark-brown urine; spitting up blood or brown material that looks like coffee grounds; red spots on the skin; unusual bruising or bleeding from the eye, gums, or nose  signs and symptoms of a blood clot such as changes in vision; chest pain; severe, sudden headache; trouble speaking; sudden numbness or weakness of the face, arm, or leg  unexplained weight gain or swelling  unusually weak or tired  yellowing of eyes or skin Side  effects that usually do not require medical attention (report to your doctor or health care professional if they continue or are bothersome):  bruising  diarrhea  dizziness, drowsiness  headache  nausea, vomiting This list may not describe all possible side effects. Call your doctor for medical advice about side effects. You may report side effects to FDA at 1-800-FDA-1088. Where should I keep my medicine? Keep out of the reach of  children. Store at room temperature between 15 and 30 degrees C (59 and 86 degrees F). Keep container tightly closed. Throw away any unused medicine after the expiration date. NOTE: This sheet is a summary. It may not cover all possible information. If you have questions about this medicine, talk to your doctor, pharmacist, or health care provider.  2020 Elsevier/Gold Standard (2019-02-08 14:11:00)
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
## 2020-02-15 LAB — COMPREHENSIVE METABOLIC PANEL
ALT: 8 IU/L (ref 0–32)
AST: 14 IU/L (ref 0–40)
Albumin/Globulin Ratio: 2 (ref 1.2–2.2)
Albumin: 4.3 g/dL (ref 3.9–5.0)
Alkaline Phosphatase: 62 IU/L (ref 39–117)
BUN/Creatinine Ratio: 6 — ABNORMAL LOW (ref 9–23)
BUN: 6 mg/dL (ref 6–20)
Bilirubin Total: 0.3 mg/dL (ref 0.0–1.2)
CO2: 24 mmol/L (ref 20–29)
Calcium: 9.4 mg/dL (ref 8.7–10.2)
Chloride: 104 mmol/L (ref 96–106)
Creatinine, Ser: 0.99 mg/dL (ref 0.57–1.00)
GFR calc Af Amer: 89 mL/min/{1.73_m2} (ref >59)
GFR calc non Af Amer: 77 mL/min/{1.73_m2} (ref >59)
Globulin, Total: 2.1 g/dL (ref 1.5–4.5)
Glucose: 82 mg/dL (ref 65–99)
Potassium: 4.5 mmol/L (ref 3.5–5.2)
Sodium: 140 mmol/L (ref 134–144)
Total Protein: 6.4 g/dL (ref 6.0–8.5)

## 2020-02-15 LAB — CBC WITH DIFFERENTIAL/PLATELET
Basophils Absolute: 0.1 10*3/uL (ref 0.0–0.2)
Basos: 1 %
EOS (ABSOLUTE): 0.1 10*3/uL (ref 0.0–0.4)
Eos: 1 %
Hematocrit: 40.3 % (ref 34.0–46.6)
Hemoglobin: 13.8 g/dL (ref 11.1–15.9)
Immature Grans (Abs): 0 10*3/uL (ref 0.0–0.1)
Immature Granulocytes: 0 %
Lymphocytes Absolute: 2 10*3/uL (ref 0.7–3.1)
Lymphs: 27 %
MCH: 33.3 pg — ABNORMAL HIGH (ref 26.6–33.0)
MCHC: 34.2 g/dL (ref 31.5–35.7)
MCV: 97 fL (ref 79–97)
Monocytes Absolute: 0.4 10*3/uL (ref 0.1–0.9)
Monocytes: 6 %
Neutrophils Absolute: 4.6 10*3/uL (ref 1.4–7.0)
Neutrophils: 65 %
Platelets: 155 10*3/uL (ref 150–450)
RBC: 4.15 x10E6/uL (ref 3.77–5.28)
RDW: 11.6 % — ABNORMAL LOW (ref 11.7–15.4)
WBC: 7.2 10*3/uL (ref 3.4–10.8)

## 2020-03-13 ENCOUNTER — Encounter: Payer: Self-pay | Admitting: Family Medicine

## 2020-03-15 ENCOUNTER — Encounter: Payer: Self-pay | Admitting: Family Medicine

## 2020-03-15 ENCOUNTER — Other Ambulatory Visit: Payer: Self-pay

## 2020-03-15 ENCOUNTER — Ambulatory Visit (INDEPENDENT_AMBULATORY_CARE_PROVIDER_SITE_OTHER): Payer: Self-pay | Admitting: Family Medicine

## 2020-03-15 VITALS — BP 109/73 | HR 62 | Temp 97.8°F | Ht 63.0 in | Wt 120.2 lb

## 2020-03-15 DIAGNOSIS — Z0000 Encounter for general adult medical examination without abnormal findings: Secondary | ICD-10-CM

## 2020-03-15 DIAGNOSIS — Z09 Encounter for follow-up examination after completed treatment for conditions other than malignant neoplasm: Secondary | ICD-10-CM

## 2020-03-15 LAB — POCT URINALYSIS DIPSTICK
Bilirubin, UA: NEGATIVE
Blood, UA: NEGATIVE
Glucose, UA: NEGATIVE
Ketones, UA: NEGATIVE
Leukocytes, UA: NEGATIVE
Nitrite, UA: NEGATIVE
Protein, UA: POSITIVE — AB
Spec Grav, UA: >=1.03 — AB (ref 1.010–1.025)
Urobilinogen, UA: 0.2 E.U./dL
pH, UA: 5.5 (ref 5.0–8.0)
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
## 2020-08-25 ENCOUNTER — Encounter (HOSPITAL_BASED_OUTPATIENT_CLINIC_OR_DEPARTMENT_OTHER): Payer: Self-pay

## 2020-08-25 ENCOUNTER — Emergency Department (HOSPITAL_BASED_OUTPATIENT_CLINIC_OR_DEPARTMENT_OTHER): Payer: Self-pay

## 2020-08-25 ENCOUNTER — Other Ambulatory Visit: Payer: Self-pay

## 2020-08-25 ENCOUNTER — Emergency Department (HOSPITAL_BASED_OUTPATIENT_CLINIC_OR_DEPARTMENT_OTHER)
Admission: EM | Admit: 2020-08-25 | Discharge: 2020-08-25 | Disposition: A | Discharge status: Home / Self Care | Payer: Self-pay | Attending: Emergency Medicine | Admitting: Emergency Medicine

## 2020-08-25 DIAGNOSIS — Z7951 Long term (current) use of inhaled steroids: Secondary | ICD-10-CM | POA: Insufficient documentation

## 2020-08-25 DIAGNOSIS — J45909 Unspecified asthma, uncomplicated: Secondary | ICD-10-CM | POA: Insufficient documentation

## 2020-08-25 DIAGNOSIS — Z20822 Contact with and (suspected) exposure to covid-19: Secondary | ICD-10-CM | POA: Insufficient documentation

## 2020-08-25 DIAGNOSIS — R0789 Other chest pain: Secondary | ICD-10-CM | POA: Insufficient documentation

## 2020-08-25 DIAGNOSIS — R0602 Shortness of breath: Secondary | ICD-10-CM | POA: Insufficient documentation

## 2020-08-25 DIAGNOSIS — J302 Other seasonal allergic rhinitis: Secondary | ICD-10-CM

## 2020-08-25 LAB — SARS CORONAVIRUS 2 BY RT PCR (HOSPITAL ORDER, PERFORMED IN ~~LOC~~ HOSPITAL LAB): SARS Coronavirus 2: NEGATIVE

## 2020-08-25 MED ORDER — ALBUTEROL SULFATE HFA 108 (90 BASE) MCG/ACT IN AERS
2.0000 | INHALATION_SPRAY | RESPIRATORY_TRACT | Status: DC | PRN
  Administered 2020-08-25: 2 via RESPIRATORY_TRACT

## 2020-08-25 MED ORDER — HYDROXYZINE HCL 25 MG PO TABS: 25.0000 mg | ORAL_TABLET | Freq: Four times a day (QID) | ORAL | 0 refills | Status: AC

## 2020-08-25 MED ORDER — ALBUTEROL SULFATE HFA 108 (90 BASE) MCG/ACT IN AERS
INHALATION_SPRAY | RESPIRATORY_TRACT | Status: AC
  Filled 2020-08-25: qty 6.7

## 2020-08-25 NOTE — Discharge Instructions (Signed)
Please follow-up with your primary care doctor.  Please take the albuterol inhaler that you are prescribed as directed.  I also prescribed you some Vistaril which you can use as needed for anxiety.
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
## 2020-08-25 NOTE — ED Triage Notes (Signed)
Pt arrives with c/o SOB reports being out of her inhaler for about a month, NAD in triage. Pt speaking in full sentences, RT at bedside, room air 100%.

## 2020-08-26 ENCOUNTER — Telehealth: Payer: Self-pay | Admitting: Family Medicine

## 2020-08-26 NOTE — Telephone Encounter (Signed)
Pt was called VM was left for Pt to call the office to schedule appointment

## 2020-08-31 ENCOUNTER — Emergency Department (HOSPITAL_BASED_OUTPATIENT_CLINIC_OR_DEPARTMENT_OTHER)
Admission: EM | Admit: 2020-08-31 | Discharge: 2020-08-31 | Disposition: A | Discharge status: Home / Self Care | Payer: Self-pay | Attending: Emergency Medicine | Admitting: Emergency Medicine

## 2020-08-31 ENCOUNTER — Encounter (HOSPITAL_BASED_OUTPATIENT_CLINIC_OR_DEPARTMENT_OTHER): Payer: Self-pay | Admitting: Emergency Medicine

## 2020-08-31 ENCOUNTER — Other Ambulatory Visit: Payer: Self-pay

## 2020-08-31 DIAGNOSIS — J4540 Moderate persistent asthma, uncomplicated: Secondary | ICD-10-CM | POA: Insufficient documentation

## 2020-08-31 DIAGNOSIS — J069 Acute upper respiratory infection, unspecified: Secondary | ICD-10-CM | POA: Insufficient documentation

## 2020-08-31 DIAGNOSIS — Z79899 Other long term (current) drug therapy: Secondary | ICD-10-CM | POA: Insufficient documentation

## 2020-08-31 DIAGNOSIS — Z20822 Contact with and (suspected) exposure to covid-19: Secondary | ICD-10-CM | POA: Insufficient documentation

## 2020-08-31 LAB — GROUP A STREP BY PCR: Group A Strep by PCR: NOT DETECTED

## 2020-08-31 MED ORDER — IBUPROFEN 400 MG PO TABS
600.0000 mg | ORAL_TABLET | Freq: Once | ORAL | Status: AC
  Administered 2020-08-31: 600 mg via ORAL
  Filled 2020-08-31: qty 1

## 2020-08-31 MED ORDER — IBUPROFEN 600 MG PO TABS: 600.0000 mg | ORAL_TABLET | Freq: Four times a day (QID) | ORAL | 0 refills | Status: AC | PRN

## 2020-08-31 MED ORDER — LIDOCAINE VISCOUS HCL 2 % MT SOLN
15.0000 mL | Freq: Once | OROMUCOSAL | Status: AC
  Administered 2020-08-31: 15 mL via OROMUCOSAL
  Filled 2020-08-31: qty 15

## 2020-08-31 NOTE — ED Notes (Signed)
Pt discharged to home. Discharge instructions have been discussed with patient and/or family members. Pt verbally acknowledges understanding d/c instructions, and endorses comprehension to checkout at registration before leaving.  °
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
## 2020-08-31 NOTE — ED Notes (Signed)
Pt refused respiratory panel swab

## 2020-08-31 NOTE — ED Notes (Signed)
Pt refuses resp panel swab, EDP notified

## 2020-08-31 NOTE — ED Notes (Signed)
Pharmacy and medications updated with patient 

## 2020-08-31 NOTE — ED Triage Notes (Signed)
PT here with sore throat x 1 week and nasal congestion. OTC meds are not working.

## 2020-09-10 ENCOUNTER — Ambulatory Visit (INDEPENDENT_AMBULATORY_CARE_PROVIDER_SITE_OTHER): Payer: Self-pay | Admitting: Family Medicine

## 2020-09-10 ENCOUNTER — Encounter: Payer: Self-pay | Admitting: Family Medicine

## 2020-09-10 ENCOUNTER — Other Ambulatory Visit: Payer: Self-pay

## 2020-09-10 VITALS — BP 136/99 | HR 85 | Temp 98.2°F | Resp 17 | Ht 63.0 in | Wt 119.8 lb

## 2020-09-10 DIAGNOSIS — R0602 Shortness of breath: Secondary | ICD-10-CM

## 2020-09-10 DIAGNOSIS — Z09 Encounter for follow-up examination after completed treatment for conditions other than malignant neoplasm: Secondary | ICD-10-CM

## 2020-09-10 DIAGNOSIS — F419 Anxiety disorder, unspecified: Secondary | ICD-10-CM

## 2020-09-10 DIAGNOSIS — Z0000 Encounter for general adult medical examination without abnormal findings: Secondary | ICD-10-CM

## 2020-09-10 LAB — POCT URINALYSIS DIPSTICK
Bilirubin, UA: NEGATIVE
Blood, UA: NEGATIVE
Glucose, UA: NEGATIVE
Ketones, UA: NEGATIVE
Leukocytes, UA: NEGATIVE
Nitrite, UA: NEGATIVE
Protein, UA: POSITIVE — AB
Spec Grav, UA: >=1.03 — AB (ref 1.010–1.025)
Urobilinogen, UA: 0.2 E.U./dL
pH, UA: 5.5 (ref 5.0–8.0)
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
## 2020-09-10 NOTE — Progress Notes (Signed)
Integrated Behavioral Health Behavioral Health Screening    09/10/2020 Name: Kimberly Golden MRN: [id number] DOB: [birth year] Kimberly Golden is a 29 y.o. year old female who sees Kallie Locks, FNP for primary care. LCSW was consulted to assess mental health needs and assist the patient with Mental Health Counseling and Resources. Assessed thoughts of SI, plan and access to means.  Interpreter: No.   Interpreter Name & Language: none  SUBJECTIVE: Presenting issue / symptoms/concerns: depression Duration of symptoms/ how impacting: several years, episodes "come in waves" and patient feels very down for a few weeks at a time Recent life changes: school and work Family / Social support: mother, sister, close friend  Mood: Euthymic Affect: Appropriate  OBJECTIVE:  Psychiatric History - Diagnoses: anxiety - Hospitalizations/ prior attempts: prior attempt in 2016 - Pharmacotherapy: none - Outpatient therapy: none Family history of psychiatric issues: not known Current and history of substance use: marijuana  PHQ-9 score of 0 is an indication of mild depression. Patient reported she hasn't felt the depressive symptoms in the past two weeks, hence the answers on today's screening. Prior to these last two weeks, she had an episode lasting about three weeks. Symptoms at that time were indicative of severe depression. Depression screen [facility] 2/9 09/10/2020 03/15/2020 03/07/2019  Decreased Interest 0 0 0  Down, Depressed, Hopeless 0 0 0  PHQ - 2 Score 0 0 0     No flowsheet data found.  Outpatient Encounter Medications as of 09/10/2020  Medication Sig Note   albuterol (PROVENTIL HFA;VENTOLIN HFA) 108 (90 Base) MCG/ACT inhaler Inhale 1-2 puffs into the lungs every 6 (six) hours as needed for wheezing or shortness of breath. 02/14/2020: As needed.    cetirizine (ZYRTEC) 10 MG tablet Take 1 tablet (10 mg total) by mouth daily.    montelukast (SINGULAIR) 10 MG tablet Take 1 tablet (10 mg total)  by mouth at bedtime.    albuterol (PROVENTIL) (2.5 MG/3ML) 0.083% nebulizer solution Take 3 mLs (2.5 mg total) by nebulization every 6 (six) hours as needed for wheezing or shortness of breath. (Patient not taking: Reported on 09/10/2020) 02/14/2020: As needed.    ibuprofen (ADVIL) 600 MG tablet Take 1 tablet (600 mg total) by mouth every 6 (six) hours as needed. (Patient not taking: Reported on 09/10/2020)    No facility-administered encounter medications on file as of 09/10/2020.   Review of patient status, including review of consultants reports, relevant laboratory and other test results, and collaboration with appropriate care team members and the patient's provider was performed as part of comprehensive patient evaluation and provision of services.    Assessment: Patient is currently experiencing symptoms of depression which are exacerbated by feeling a lack of sense of purpose. Patient works and attends school. She has social support from family and a close friend.   Recommendation: Patient may benefit from, and is in agreement to outpatient counseling at [facility]).    Intervention:Patient interviewed and appropriate assessments performed: brief mental health assessment and PHQ 9  Collaborated with primary care provider re: referral*  Referred patient to [facility] (mental health provider) for long term follow up and therapy/counseling  Emotional/Supportive Counseling   Safety Planning  Patient reports prior suicide attempt several years ago in 2016. She was not hospitalized at that time, family intervened. Afterwards patient resolved to make an effort to be productive and looked for work/school. She last had thoughts of harming herself about three  weeks ago. She did not have a plan at that time. She denies thoughts or a plan for harming herself today. She does not have access to lethal means. Safety planned with  patient today. Provided contact information for crisis resources: [facility] Crisis Line [phone]; National Suicide Prevention Lifeline 1-800-273-TALK 415 128 1781); Crisis Text Line 380 820 7823.    Patient is agreeable to outpatient counseling at [facility], as she is uninsured. Consulted with PCP to make referral. CSW made appointment for patient at [facility] for 11/04/20. Patient may walk-in for services if needed before then. Advised that patient may also schedule some short term support sessions with this CSW until bridged to provider at [facility].   SDOH (Social Determinants of Health) assessments performed: No   Goals Addressed   None     Follow Up Plan:  1. CSW will assist in following up with [facility] to make outpatient counseling referral  2. CSW available from clinic as needed  [name], LCSW [facility] [phone]

## 2020-09-14 ENCOUNTER — Encounter: Payer: Self-pay | Admitting: Family Medicine

## 2020-09-17 ENCOUNTER — Telehealth: Payer: Self-pay | Admitting: Family Medicine

## 2020-09-17 ENCOUNTER — Telehealth: Payer: Self-pay | Admitting: Clinical

## 2020-09-17 NOTE — Telephone Encounter (Signed)
   Kimberly Golden DOB: July 08, 1991 MRN: [id number]   RIDER WAIVER AND RELEASE OF LIABILITY  For purposes of improving physical access to our facilities, [place] is pleased to partner with third parties to provide [place] patients or other authorized individuals the option of convenient, on-demand ground transportation services (the [employer]") through use of the technology service that enables users to request on-demand ground transportation from independent third-party providers.  By opting to use and accept these [employer], I, the undersigned, hereby agree on behalf of myself, and on behalf of any minor child using the [employer] for whom I am the parent or legal guardian, as follows:  1. Science writer provided to me are provided by independent third-party transportation providers who are not [employer] or employees and who are unaffiliated with [employer]. 2. [place] is neither a transportation carrier nor a common or public carrier. 3. [place] has no control over the quality or safety of the transportation that occurs as a result of the [employer]. 4. [place] cannot guarantee that any third-party transportation provider will complete any arranged transportation service. 5. [place] makes no representation, warranty, or guarantee regarding the reliability, timeliness, quality, safety, suitability, or availability of any of the Transport Services or that they will be error free. 6. I fully understand that traveling by vehicle involves risks and dangers of serious bodily injury, including permanent disability, paralysis, and death. I agree, on behalf of myself and on behalf of any minor child using the Transport Services for whom I am the parent or legal guardian, that the entire risk arising out of my use of the [employer] remains solely with me, to the maximum extent permitted under applicable law. 7. The [employer] are provided "as is" and "as available." [facility] disclaims all representations and warranties, express, implied or statutory, not expressly set out in these terms, including the implied warranties of merchantability and fitness for a particular purpose. 8. I hereby waive and release [place], its agents, employees, officers, directors, representatives, insurers, attorneys, assigns, successors, subsidiaries, and affiliates from any and all past, present, or future claims, demands, liabilities, actions, causes of action, or suits of any kind directly or indirectly arising from acceptance and use of the [employer]. 9. I further waive and release [place] and its affiliates from all present and future liability and responsibility for any injury or death to persons or damages to property caused by or related to the use of the [employer]. 10. I have read this Waiver and Release of Liability, and I understand the terms used in it and their legal significance. This Waiver is freely and voluntarily given with the understanding that my right (as well as the right of any minor child for whom I am the parent or legal guardian using the [employer]) to legal recourse against [facility] in connection with the [employer] is knowingly surrendered in return for use of these services.   I attest that I read the consent document to Kimberly Golden, gave Kimberly Golden the opportunity to ask questions and answered the questions asked (if any). I affirm that Kimberly Golden then provided consent for she's participation in this program.     Kimberly Golden

## 2020-09-17 NOTE — Telephone Encounter (Signed)
Integrated Behavioral Health General Follow Up Note  09/17/2020 Name: Kimberly Golden MRN: [id number] DOB: November 05, 1991 Kimberly Golden is a 29 y.o. year old female who sees Kallie Locks, FNP for primary care. LCSW was initially consulted to assess mental health needs and assist the patient with Mental Health Counseling and Resources.  Interpreter: No.   Interpreter Name & Language: none  Assessment: Patient experiencing depression. Today patient returned CSW's call from yesterday. Patient had called Friday 09/13/20 while CSW out of office and left a voicemail in which she sounded upset and tearful. She had been unable to go for a walk-in at [facility]) as planned last week.  Ongoing Intervention: CSW assessed patient's risk today. Patient denied thoughts of harming herself. Patient was somewhat tearful. Expressed feeling down and stressed about work, school, and helping family members. Problem solved and provided supportive counseling. Patient is unable to afford gas and was stressed about transport to  [facility]. Enrolled patient in [facility] transportation services. Patient then scheduled ride to [facility] for this Thursday.   Supportive counseling related to patient's satisfaction at work and with career trajectory. Patient expressed interest in volunteering in a field more interesting to her. Provided patient with information on some volunteering opportunities. Validation and reflective listening provided.   Review of patient status, including review of consultants reports, relevant laboratory and other test results, and collaboration with appropriate care team members and the patient's provider was performed as part of comprehensive patient evaluation and provision of services.     Follow up Plan: 1. Patient to go to [facility] this week for walk-in, as she is unable to get a scheduled appointment until late in November. 2. CSW to check in with patient next week via  phone. CSW will be available from clinic as needed.  [name], LCSW Patient [facility] [phone]

## 2020-09-23 ENCOUNTER — Other Ambulatory Visit: Payer: Self-pay

## 2020-09-23 ENCOUNTER — Encounter (HOSPITAL_BASED_OUTPATIENT_CLINIC_OR_DEPARTMENT_OTHER): Payer: Self-pay | Admitting: *Deleted

## 2020-09-23 ENCOUNTER — Emergency Department (HOSPITAL_BASED_OUTPATIENT_CLINIC_OR_DEPARTMENT_OTHER)
Admission: EM | Admit: 2020-09-23 | Discharge: 2020-09-23 | Disposition: A | Discharge status: Home / Self Care | Payer: Self-pay | Attending: Emergency Medicine | Admitting: Emergency Medicine

## 2020-09-23 ENCOUNTER — Emergency Department (HOSPITAL_BASED_OUTPATIENT_CLINIC_OR_DEPARTMENT_OTHER): Payer: Self-pay

## 2020-09-23 DIAGNOSIS — Z23 Encounter for immunization: Secondary | ICD-10-CM

## 2020-09-23 DIAGNOSIS — S9031XA Contusion of right foot, initial encounter: Secondary | ICD-10-CM | POA: Insufficient documentation

## 2020-09-23 DIAGNOSIS — W091XXA Fall from playground swing, initial encounter: Secondary | ICD-10-CM | POA: Insufficient documentation

## 2020-09-23 DIAGNOSIS — T1490XA Injury, unspecified, initial encounter: Secondary | ICD-10-CM

## 2020-09-23 DIAGNOSIS — J45909 Unspecified asthma, uncomplicated: Secondary | ICD-10-CM | POA: Insufficient documentation

## 2020-09-23 MED ORDER — IBUPROFEN 800 MG PO TABS
800.0000 mg | ORAL_TABLET | Freq: Once | ORAL | Status: AC
  Administered 2020-09-23: 800 mg via ORAL
  Filled 2020-09-23: qty 1
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
## 2020-09-23 NOTE — ED Triage Notes (Signed)
Pt reports she jumped off swingset and "stumped" her right foot today

## 2020-10-07 ENCOUNTER — Telehealth: Payer: Self-pay | Admitting: Clinical

## 2020-10-07 NOTE — Telephone Encounter (Signed)
[facility] General Follow Up Note  10/07/2020 Name: Kimberly Golden MRN: [id number] DOB: [birth year] Kimberly Golden is a 29 y.o. year old female who sees Kallie Locks, FNP for primary care. LCSW was initially consulted to assess mental health needs and assist the patient with Mental Health Counseling and Resources.  Interpreter: No.   Interpreter Name & Language: none  Assessment: Patient experiencing depression.  Ongoing Intervention: This was an unsuccessful contact. Called patient on 09/25/20 to follow up on patient's plans to walk in at [facility]). LVM at that time. Attempted to reach patient again today and LVM. CSW is available from clinic as needed for support.   Review of patient status, including review of consultants reports, relevant laboratory and other test results, and collaboration with appropriate care team members and the patient's provider was performed as part of comprehensive patient evaluation and provision of services.     Follow up Plan: 1. CSW available from clinic as needed.   [name], LCSW [facility] [phone]

## 2020-11-04 ENCOUNTER — Ambulatory Visit (HOSPITAL_COMMUNITY): Payer: No Payment, Other | Admitting: Licensed Clinical Social Worker

## 2020-12-05 IMAGING — DX DG CHEST 1V PORT
1 series · 1 of 1 positions shown · non-contrast
Comparison: Chest radiographs 03/12/2018 and earlier.

CLINICAL DATA: 27-year-old female with shortness of breath. History
of asthma.

EXAM:
PORTABLE CHEST 1 VIEW

[chest ap]
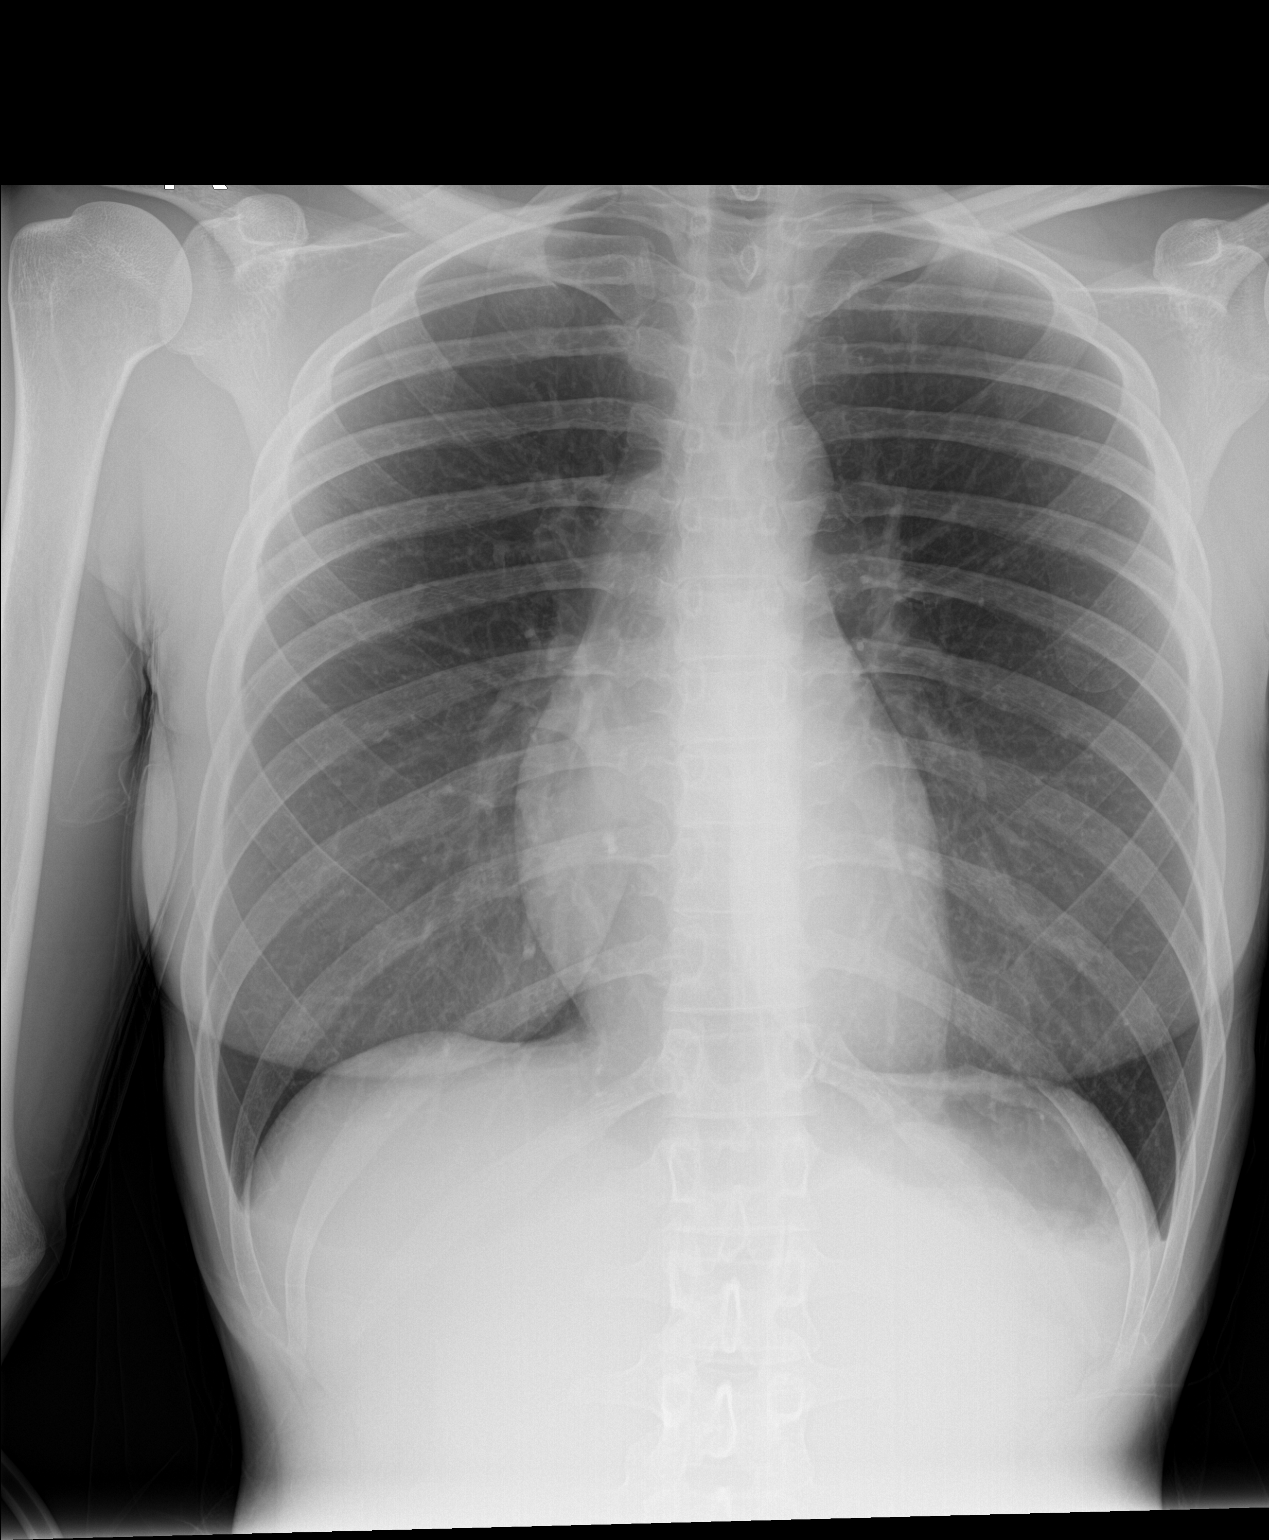

[1 of 1 positions shown; findings below may reference images not displayed]

FINDINGS: Portable AP upright view at 0590 hours. Lung volumes are stable at
the upper limits of normal. Normal cardiac size and mediastinal
contours. Visualized tracheal air column is within normal limits.
Allowing for portable technique the lungs are clear. No
pneumothorax. No osseous abnormality identified.
IMPRESSION: Negative portable chest.

## 2020-12-10 ENCOUNTER — Ambulatory Visit: Payer: Self-pay | Admitting: Family Medicine

## 2020-12-19 ENCOUNTER — Encounter (HOSPITAL_BASED_OUTPATIENT_CLINIC_OR_DEPARTMENT_OTHER): Payer: Self-pay

## 2020-12-19 ENCOUNTER — Other Ambulatory Visit: Payer: Self-pay

## 2020-12-19 ENCOUNTER — Other Ambulatory Visit (HOSPITAL_BASED_OUTPATIENT_CLINIC_OR_DEPARTMENT_OTHER): Payer: Self-pay | Admitting: Emergency Medicine

## 2020-12-19 ENCOUNTER — Emergency Department (HOSPITAL_BASED_OUTPATIENT_CLINIC_OR_DEPARTMENT_OTHER)
Admission: EM | Admit: 2020-12-19 | Discharge: 2020-12-19 | Disposition: A | Discharge status: Home / Self Care | Payer: HRSA Program | Attending: Emergency Medicine | Admitting: Emergency Medicine

## 2020-12-19 DIAGNOSIS — Z7951 Long term (current) use of inhaled steroids: Secondary | ICD-10-CM | POA: Diagnosis not present

## 2020-12-19 DIAGNOSIS — U071 COVID-19: Secondary | ICD-10-CM | POA: Diagnosis not present

## 2020-12-19 DIAGNOSIS — J4540 Moderate persistent asthma, uncomplicated: Secondary | ICD-10-CM | POA: Insufficient documentation

## 2020-12-19 DIAGNOSIS — B349 Viral infection, unspecified: Secondary | ICD-10-CM

## 2020-12-19 DIAGNOSIS — Z20822 Contact with and (suspected) exposure to covid-19: Secondary | ICD-10-CM

## 2020-12-19 DIAGNOSIS — R509 Fever, unspecified: Secondary | ICD-10-CM | POA: Diagnosis present

## 2020-12-19 LAB — SARS CORONAVIRUS 2 (TAT 6-24 HRS): SARS Coronavirus 2: POSITIVE — AB

## 2020-12-19 MED ORDER — BENZONATATE 100 MG PO CAPS: 100.0000 mg | ORAL_CAPSULE | Freq: Three times a day (TID) | ORAL | 0 refills | Status: DC

## 2020-12-19 MED ORDER — FLUTICASONE PROPIONATE 50 MCG/ACT NA SUSP: 2.0000 | Freq: Every day | NASAL | 0 refills | Status: DC

## 2020-12-19 MED ORDER — METHOCARBAMOL 500 MG PO TABS: 500.0000 mg | ORAL_TABLET | Freq: Two times a day (BID) | ORAL | 0 refills | Status: DC

## 2020-12-19 MED ORDER — ONDANSETRON 4 MG PO TBDP: 4.0000 mg | ORAL_TABLET | Freq: Three times a day (TID) | ORAL | 0 refills | Status: DC | PRN

## 2020-12-19 MED ORDER — PROMETHAZINE-DM 6.25-15 MG/5ML PO SYRP: 5.0000 mL | ORAL_SOLUTION | Freq: Four times a day (QID) | ORAL | 0 refills | Status: DC | PRN

## 2020-12-19 MED ORDER — ONDANSETRON 4 MG PO TBDP
8.0000 mg | ORAL_TABLET | Freq: Once | ORAL | Status: AC
  Administered 2020-12-19: 8 mg via ORAL
  Filled 2020-12-19: qty 2

## 2020-12-19 MED ORDER — ACETAMINOPHEN 325 MG PO TABS
650.0000 mg | ORAL_TABLET | Freq: Once | ORAL | Status: AC
  Administered 2020-12-19: 650 mg via ORAL
  Filled 2020-12-19: qty 2

## 2020-12-19 MED FILL — METHOCARBAMOL 500 MG TABLET: 500 | 10 days supply | Qty: 20 | Fill #0

## 2020-12-19 MED FILL — BENZONATATE 100 MG CAPS: 100 | 7 days supply | Qty: 21 | Fill #0

## 2020-12-19 MED FILL — PROMETHAZINE W/DM SYRUP: 6.25-15 | 10 days supply | Qty: 118 | Fill #0

## 2020-12-19 NOTE — ED Notes (Signed)
Tolerated PO intake.  No vomiting noted.

## 2020-12-19 NOTE — Discharge Instructions (Addendum)
Your  COVID test is pending please expect results in 6-24 hours.  Take Tylenol and ibuprofen I also prescribed you Zofran for nausea. Please use Tylenol or ibuprofen for pain.  You may use 600 mg ibuprofen every 6 hours or 1000 mg of Tylenol every 6 hours.  You may choose to alternate between the 2.  This would be most effective.  Not to exceed 4 g of Tylenol within 24 hours.  Not to exceed 3200 mg ibuprofen 24 hours.  I also prescribed a muscle relaxer for body aches.   Monitor your symptoms may always return to the ER for any new or concerning ones.  If your COVID test is positive please read the below information.  Your COVID test is pending; you should expect results within 24 hours. You can access your results on your MyChart--if you test positive you should receive a phone call.  In the meantime follow CDC guidelines and quarantine, wear a mask, wash hands often.   Please take over the counter vitamin D 2000-4000 units per day. I also recommend zinc 50 mg per day for the next two weeks.   Please return to ED if you feel have difficulty breathing or have emergent, new or concerning symptoms.  Patients who have symptoms consistent with COVID-19 should self isolated for: At least 3 days (72 hours) have passed since recovery, defined as resolution of fever without the use of fever reducing medications and improvement in respiratory symptoms (e.g., cough, shortness of breath), and At least 7 days have passed since symptoms first appeared.       Person Under Monitoring Name: Kimberly Golden  Location: [address] [place] [address]   Infection Prevention Recommendations for Individuals Confirmed to have, or Being Evaluated for, 2019 Novel Coronavirus (COVID-19) Infection Who Receive Care at Home  Individuals who are confirmed to have, or are being evaluated for, COVID-19 should follow the prevention steps below until a healthcare provider or local or state health  department says they can return to normal activities.  Stay home except to get medical care You should restrict activities outside your home, except for getting medical care. Do not go to work, school, or public areas, and do not use public transportation or taxis.  Call ahead before visiting your doctor Before your medical appointment, call the healthcare provider and tell them that you have, or are being evaluated for, COVID-19 infection. This will help the healthcare provider's office take steps to keep other people from getting infected. Ask your healthcare provider to call the local or state health department.  Monitor your symptoms Seek prompt medical attention if your illness is worsening (e.g., difficulty breathing). Before going to your medical appointment, call the healthcare provider and tell them that you have, or are being evaluated for, COVID-19 infection. Ask your healthcare provider to call the local or state health department.  Wear a facemask You should wear a facemask that covers your nose and mouth when you are in the same room with other people and when you visit a healthcare provider. People who live with or visit you should also wear a facemask while they are in the same room with you.  Separate yourself from other people in your home As much as possible, you should stay in a different room from other people in your home. Also, you should use a separate bathroom, if available.  Avoid sharing household items You should not share dishes, drinking glasses, cups, eating utensils, towels,  bedding, or other items with other people in your home. After using these items, you should wash them thoroughly with soap and water.  Cover your coughs and sneezes Cover your mouth and nose with a tissue when you cough or sneeze, or you can cough or sneeze into your sleeve. Throw used tissues in a lined trash can, and immediately wash your hands with soap and water for at least 20  seconds or use an alcohol-based hand rub.  Wash your [employer] your hands often and thoroughly with soap and water for at least 20 seconds. You can use an alcohol-based hand sanitizer if soap and water are not available and if your hands are not visibly dirty. Avoid touching your eyes, nose, and mouth with unwashed hands.   Prevention Steps for Caregivers and Household Members of Individuals Confirmed to have, or Being Evaluated for, COVID-19 Infection Being Cared for in the Home  If you live with, or provide care at home for, a person confirmed to have, or being evaluated for, COVID-19 infection please follow these guidelines to prevent infection:  Follow healthcare provider's instructions Make sure that you understand and can help the patient follow any healthcare provider instructions for all care.  Provide for the patient's basic needs You should help the patient with basic needs in the home and provide support for getting groceries, prescriptions, and other personal needs.  Monitor the patient's symptoms If they are getting sicker, call his or her medical provider and tell them that the patient has, or is being evaluated for, COVID-19 infection. This will help the healthcare provider's office take steps to keep other people from getting infected. Ask the healthcare provider to call the local or state health department.  Limit the number of people who have contact with the patient If possible, have only one caregiver for the patient. Other household members should stay in another home or place of residence. If this is not possible, they should stay in another room, or be separated from the patient as much as possible. Use a separate bathroom, if available. Restrict visitors who do not have an essential need to be in the home.  Keep older adults, very young children, and other sick people away from the patient Keep older adults, very young children, and those who have  compromised immune systems or chronic health conditions away from the patient. This includes people with chronic heart, lung, or kidney conditions, diabetes, and cancer.  Ensure good ventilation Make sure that shared spaces in the home have good air flow, such as from an air conditioner or an opened window, weather permitting.  Wash your hands often Wash your hands often and thoroughly with soap and water for at least 20 seconds. You can use an alcohol based hand sanitizer if soap and water are not available and if your hands are not visibly dirty. Avoid touching your eyes, nose, and mouth with unwashed hands. Use disposable paper towels to dry your hands. If not available, use dedicated cloth towels and replace them when they become wet.  Wear a facemask and gloves Wear a disposable facemask at all times in the room and gloves when you touch or have contact with the patient's blood, body fluids, and/or secretions or excretions, such as sweat, saliva, sputum, nasal mucus, vomit, urine, or feces.  Ensure the mask fits over your nose and mouth tightly, and do not touch it during use. Throw out disposable facemasks and gloves after using them. Do not reuse. Wash your  hands immediately after removing your facemask and gloves. If your personal clothing becomes contaminated, carefully remove clothing and launder. Wash your hands after handling contaminated clothing. Place all used disposable facemasks, gloves, and other waste in a lined container before disposing them with other household waste. Remove gloves and wash your hands immediately after handling these items.  Do not share dishes, glasses, or other household items with the patient Avoid sharing household items. You should not share dishes, drinking glasses, cups, eating utensils, towels, bedding, or other items with a patient who is confirmed to have, or being evaluated for, COVID-19 infection. After the person uses these items, you should  wash them thoroughly with soap and water.  Wash laundry thoroughly Immediately remove and wash clothes or bedding that have blood, body fluids, and/or secretions or excretions, such as sweat, saliva, sputum, nasal mucus, vomit, urine, or feces, on them. Wear gloves when handling laundry from the patient. Read and follow directions on labels of laundry or clothing items and detergent. In general, wash and dry with the warmest temperatures recommended on the label.  Clean all areas the individual has used often Clean all touchable surfaces, such as counters, tabletops, doorknobs, bathroom fixtures, toilets, phones, keyboards, tablets, and bedside tables, every day. Also, clean any surfaces that may have blood, body fluids, and/or secretions or excretions on them. Wear gloves when cleaning surfaces the patient has come in contact with. Use a diluted bleach solution (e.g., dilute bleach with 1 part bleach and 10 parts water) or a household disinfectant with a label that says EPA-registered for coronaviruses. To make a bleach solution at home, add 1 tablespoon of bleach to 1 quart (4 cups) of water. For a larger supply, add  cup of bleach to 1 gallon (16 cups) of water. Read labels of cleaning products and follow recommendations provided on product labels. Labels contain instructions for safe and effective use of the cleaning product including precautions you should take when applying the product, such as wearing gloves or eye protection and making sure you have good ventilation during use of the product. Remove gloves and wash hands immediately after cleaning.  Monitor yourself for signs and symptoms of illness Caregivers and household members are considered close contacts, should monitor their health, and will be asked to limit movement outside of the home to the extent possible. Follow the monitoring steps for close contacts listed on the symptom monitoring form.   ? If you have additional  questions, contact your local health department or call the epidemiologist on call at [phone] (available 24/7). ? This guidance is subject to change. For the most up-to-date guidance from [facility], please refer to their website: [employer]

## 2020-12-19 NOTE — ED Notes (Signed)
PT requesting water, PT informed of policy regarding NPO status and educated on why the policy exists. PT continued to disagree

## 2020-12-19 NOTE — ED Triage Notes (Signed)
Pt arrives with c/o body aches and sore throat starting yesterday, no medications today PTA.
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
## 2020-12-20 ENCOUNTER — Telehealth: Payer: Self-pay | Admitting: Family

## 2020-12-20 NOTE — Telephone Encounter (Signed)
Called to discuss with patient about COVID-19 symptoms and the use of one of the available treatments for those with mild to moderate Covid symptoms and at a high risk of hospitalization.  Pt appears to qualify for outpatient treatment due to co-morbid conditions and/or a member of an at-risk group in accordance with the FDA Emergency Use Authorization.    Symptom onset: Unknown Vaccinated: Unknown Booster? Unknown Immunocompromised? No Qualifiers: Ethnicity, Asthma  Unable to reach pt - Left VM requesting call back. Sent MyChart message.   Kimberly Golden

## 2021-01-03 ENCOUNTER — Ambulatory Visit: Payer: Self-pay | Admitting: Family Medicine

## 2021-04-13 ENCOUNTER — Emergency Department (HOSPITAL_BASED_OUTPATIENT_CLINIC_OR_DEPARTMENT_OTHER)
Admission: EM | Admit: 2021-04-13 | Discharge: 2021-04-13 | Disposition: A | Discharge status: Home / Self Care | Payer: Self-pay | Attending: Emergency Medicine | Admitting: Emergency Medicine

## 2021-04-13 ENCOUNTER — Other Ambulatory Visit: Payer: Self-pay

## 2021-04-13 ENCOUNTER — Encounter (HOSPITAL_BASED_OUTPATIENT_CLINIC_OR_DEPARTMENT_OTHER): Payer: Self-pay | Admitting: Emergency Medicine

## 2021-04-13 DIAGNOSIS — J45909 Unspecified asthma, uncomplicated: Secondary | ICD-10-CM | POA: Insufficient documentation

## 2021-04-13 DIAGNOSIS — K0889 Other specified disorders of teeth and supporting structures: Secondary | ICD-10-CM | POA: Insufficient documentation

## 2021-04-13 DIAGNOSIS — Z7951 Long term (current) use of inhaled steroids: Secondary | ICD-10-CM | POA: Insufficient documentation

## 2021-04-13 MED ORDER — PENICILLIN V POTASSIUM 250 MG PO TABS
500.0000 mg | ORAL_TABLET | Freq: Once | ORAL | Status: AC
  Administered 2021-04-13: 500 mg via ORAL
  Filled 2021-04-13: qty 2

## 2021-04-13 MED ORDER — ACETAMINOPHEN 500 MG PO TABS
1000.0000 mg | ORAL_TABLET | Freq: Once | ORAL | Status: AC
  Administered 2021-04-13: 1000 mg via ORAL
  Filled 2021-04-13: qty 2

## 2021-04-13 MED ORDER — PENICILLIN V POTASSIUM 500 MG PO TABS: 500.0000 mg | ORAL_TABLET | Freq: Four times a day (QID) | ORAL | 0 refills | Status: AC

## 2021-04-13 NOTE — ED Triage Notes (Signed)
Reports dental pain x ~1 month on L side of mouth top and bottom. States she does not have a dentist currently due to new employment. Denies facial swelling, ?gums swollen.

## 2021-04-13 NOTE — Discharge Instructions (Addendum)
Take antibiotics as directed. Please take all of your antibiotics until finished.  You can take Tylenol or Ibuprofen as directed for pain. You can alternate Tylenol and Ibuprofen every 4 hours. If you take Tylenol at 1pm, then you can take Ibuprofen at 5pm. Then you can take Tylenol again at 9pm.   The exam and treatment you received today has been provided on an emergency basis only. This is not a substitute for complete medical or dental care. If your problem worsens or new symptoms (problems) appear, and you are unable to arrange prompt follow-up care with your dentist, call or return to this location. If you do not have a dentist, please follow-up with one on the list provided  CALL YOUR DENTIST OR RETURN IMMEDIATELY IF you develop a fever, rash, difficulty breathing or swallowing, neck or facial swelling, or other potentially serious concerns.   Please follow-up with one of the dental clinics provided to you below or in your paperwork. Call and tell them you were seen in the Emergency Dept and arrange for an appointment. You may have to call multiple places in order to find a place to be seen.  Dental Assistance If the dentist on-call cannot see you, please use the resources below:   Patients with Medicaid: [facility] [address], [phone], [phone]  If unable to pay, or uninsured, contact [facility] ([phone]) or [facility] ([phone] in [place], [phone] in [place]) to become qualified for the adult dental clinic  Other Low-Cost Community Dental Services: [facility]- [address], [place], [place], [address]    [phone], Ext. [phone]    2nd and 4th Thursday of the month at 6:30am    10 clients each day by appointment, can sometimes see walk-in     patients if someone does not show for an appointment [facility]- [address], [place], [place], [address]    [phone]  [facility]- [address], [place], , [address]    [phone]  [facility]- [phone] [facility]- [phone] [facility]- [phone]  
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
## 2021-12-29 ENCOUNTER — Other Ambulatory Visit: Payer: Self-pay

## 2021-12-29 ENCOUNTER — Emergency Department (HOSPITAL_BASED_OUTPATIENT_CLINIC_OR_DEPARTMENT_OTHER)
Admission: EM | Admit: 2021-12-29 | Discharge: 2021-12-29 | Disposition: A | Discharge status: Home / Self Care | Payer: Self-pay | Attending: Emergency Medicine | Admitting: Emergency Medicine

## 2021-12-29 ENCOUNTER — Encounter (HOSPITAL_BASED_OUTPATIENT_CLINIC_OR_DEPARTMENT_OTHER): Payer: Self-pay | Admitting: *Deleted

## 2021-12-29 DIAGNOSIS — Z7951 Long term (current) use of inhaled steroids: Secondary | ICD-10-CM | POA: Insufficient documentation

## 2021-12-29 DIAGNOSIS — N611 Abscess of the breast and nipple: Secondary | ICD-10-CM | POA: Insufficient documentation

## 2021-12-29 DIAGNOSIS — Z79899 Other long term (current) drug therapy: Secondary | ICD-10-CM | POA: Insufficient documentation

## 2021-12-29 MED ORDER — DOXYCYCLINE HYCLATE 100 MG PO CAPS: 100.0000 mg | ORAL_CAPSULE | Freq: Two times a day (BID) | ORAL | 0 refills | Status: AC

## 2021-12-29 NOTE — ED Triage Notes (Signed)
Abscess to her left breast.

## 2021-12-29 NOTE — ED Provider Notes (Signed)
[facility] EMERGENCY DEPARTMENT Provider Note   CSN: [id number] Arrival date & time: 12/29/21  1530     History  Chief Complaint  Patient presents with   Abscess    Kimberly Golden is a 31 y.o. female presenting for evaluation of L breast pain.   Patient states she has a history of restlessness abscesses.  For the past week, she has had pain and swelling of her left breast, which worsened on Friday.  No drainage.  It is increasing in redness.  No fevers or chills.  She has tried Tylenol, has not taken anything else for symptoms.  She does not currently have an OB/GYN or PCP    HPI     Home Medications Prior to Admission medications   Medication Sig Start Date End Date Taking? Authorizing Provider  albuterol (PROVENTIL HFA;VENTOLIN HFA) 108 (90 Base) MCG/ACT inhaler Inhale 1-2 puffs into the lungs every 6 (six) hours as needed for wheezing or shortness of breath. 03/07/19  Yes Kallie Locks, FNP  albuterol (PROVENTIL) (2.5 MG/3ML) 0.083% nebulizer solution Take 3 mLs (2.5 mg total) by nebulization every 6 (six) hours as needed for wheezing or shortness of breath. Patient not taking: Reported on 09/10/2020 03/07/19   Kallie Locks, FNP  cetirizine (ZYRTEC) 10 MG tablet Take 1 tablet (10 mg total) by mouth daily. 02/14/20   Kallie Locks, FNP  doxycycline (VIBRAMYCIN) 100 MG capsule Take 1 capsule (100 mg total) by mouth 2 (two) times daily for 7 days. 12/29/21 01/05/22 Yes Parris Signer, PA-C  fluticasone (FLONASE) 50 MCG/ACT nasal spray PLACE 2 SPRAYS INTO BOTH NOSTRILS DAILY FOR 14 DAYS. 12/19/20 12/19/21  Gailen Shelter, PA  ibuprofen (ADVIL) 600 MG tablet Take 1 tablet (600 mg total) by mouth every 6 (six) hours as needed. Patient not taking: Reported on 09/10/2020 08/31/20   Jacalyn Lefevre, MD  montelukast (SINGULAIR) 10 MG tablet Take 1 tablet (10 mg total) by mouth at bedtime. 02/14/20   Kallie Locks, FNP      Allergies    Flagyl [metronidazole]     Review of Systems   Review of Systems  Skin:        L breast pain   Physical Exam Updated Vital Signs BP 106/75 (BP Location: Right Arm)    Pulse 73    Temp 98.7 F (37.1 C) (Oral)    Resp 16    Ht 5\' 3"  (1.6 m)    Wt 54.9 kg    LMP 12/10/2021    SpO2 99%    BMI 21.44 kg/m  Physical Exam Vitals and nursing note reviewed. Exam conducted with a chaperone present.  Constitutional:      General: She is not in acute distress.    Appearance: She is well-developed.  HENT:     Head: Normocephalic and atraumatic.  Eyes:     Extraocular Movements: Extraocular movements intact.  Cardiovascular:     Rate and Rhythm: Normal rate.  Pulmonary:     Effort: Pulmonary effort is normal.  Chest:       Comments: 1 cm nodule on the edge of the areola at about 11:00.  Surrounding erythema and induration of the skin.  No active drainage.  No drainage from the nipple.  No lesions noted noted elsewhere in the breast Abdominal:     General: There is no distension.  Musculoskeletal:        General: Normal range of motion.     Cervical back: Normal  range of motion.  Skin:    General: Skin is warm.     Findings: No rash.  Neurological:     Mental Status: She is alert and oriented to person, place, and time.    ED Results / Procedures / Treatments   Labs (all labs ordered are listed, but only abnormal results are displayed) Labs Reviewed - No data to display  EKG None  Radiology No results found.  Procedures Procedures    Medications Ordered in ED Medications - No data to display  ED Course/ Medical Decision Making/ A&P                           Medical Decision Making Risk Prescription drug management.    This patient presents to the ED for concern of L breast pain. This involves a number of treatment options, and is a complaint that carries with it a moderate risk of complications and morbidity.  The differential diagnosis includes cyst, abscess, cancer.  Additional  history: Reviewed for acute visit from a year ago in which patient had similar presentation on the right breast.  Per patient, she followed up with the breast center, had an ultrasound which showed no concerning or acute findings.  Disposition:  After consideration of the diagnostic results and the patients response to treatment, I feel that the patent would benefit from treatment with antibiotics and warm compresses.  Ideally patient would follow-up with the breast center, however as she has no PCP or OB, instead will first refer to OB/GYN.  At this time, patient appears safe for discharge.  Return precautions given.  Patient states she understands and agrees to plan.   Final Clinical Impression(s) / ED Diagnoses Final diagnoses:  Abscess of left breast    Rx / DC Orders ED Discharge Orders          Ordered    doxycycline (VIBRAMYCIN) 100 MG capsule  2 times daily        12/29/21 1649              Fraya Ueda, PA-C 12/29/21 1657    Virgina Norfolk, DO 12/29/21 1810

## 2021-12-29 NOTE — Discharge Instructions (Signed)
Take antibiotics as prescribed.  Take entire course, even if symptoms improve. Use warm compresses, 20 minutes at a time, 3 times a day to help with pain and inflammation. Do not try and drain the area, as this can cause worsening pain and swelling. I recommend you follow-up with an OB/GYN for further evaluation, and they can place order for evaluation of the breast center.  You may try either of the 2 clinics listed below. Return to the emergency room if you develop fevers, increasing pain, drainage from the nipple, or any new, worsening, or concerning symptoms

## 2022-01-01 ENCOUNTER — Encounter (HOSPITAL_BASED_OUTPATIENT_CLINIC_OR_DEPARTMENT_OTHER): Payer: Self-pay

## 2022-01-01 ENCOUNTER — Other Ambulatory Visit: Payer: Self-pay

## 2022-01-01 ENCOUNTER — Emergency Department (HOSPITAL_BASED_OUTPATIENT_CLINIC_OR_DEPARTMENT_OTHER)
Admission: EM | Admit: 2022-01-01 | Discharge: 2022-01-01 | Disposition: A | Discharge status: Home / Self Care | Payer: Self-pay | Attending: Emergency Medicine | Admitting: Emergency Medicine

## 2022-01-01 DIAGNOSIS — L0291 Cutaneous abscess, unspecified: Secondary | ICD-10-CM

## 2022-01-01 DIAGNOSIS — L732 Hidradenitis suppurativa: Secondary | ICD-10-CM

## 2022-01-01 MED ORDER — IBUPROFEN 800 MG PO TABS: 800.0000 mg | ORAL_TABLET | Freq: Three times a day (TID) | ORAL | 0 refills | Status: AC

## 2022-01-01 MED ORDER — CEPHALEXIN 250 MG PO CAPS
500.0000 mg | ORAL_CAPSULE | Freq: Once | ORAL | Status: AC
  Administered 2022-01-01: 500 mg via ORAL
  Filled 2022-01-01: qty 2

## 2022-01-01 MED ORDER — KETOROLAC TROMETHAMINE 30 MG/ML IJ SOLN
30.0000 mg | Freq: Once | INTRAMUSCULAR | Status: AC
  Administered 2022-01-01: 30 mg via INTRAMUSCULAR
  Filled 2022-01-01: qty 1

## 2022-01-01 MED ORDER — CEPHALEXIN 500 MG PO CAPS: 500.0000 mg | ORAL_CAPSULE | Freq: Three times a day (TID) | ORAL | 0 refills | Status: AC | PRN

## 2022-01-01 NOTE — ED Notes (Signed)
ED Provider at bedside. 

## 2022-01-01 NOTE — ED Provider Notes (Signed)
[facility] Provider Note   CSN: [id number] Arrival date & time: 01/01/22  [id number]     History  Chief Complaint  Patient presents with   Abscess    Kimberly Golden is a 31 y.o. female.  Pt is a 31 yo female presenting for abscess. Pt admits to painful swelling on the left breast x few days. States she was in the shower and was able to express purulent drainage with foul odor this morning. Admits to previous right breast abscess and buttocks abscess.   The history is provided by the patient. No language interpreter was used.  Abscess Associated symptoms: no fever and no vomiting       Home Medications Prior to Admission medications   Medication Sig Start Date End Date Taking? Authorizing Provider  albuterol (PROVENTIL HFA;VENTOLIN HFA) 108 (90 Base) MCG/ACT inhaler Inhale 1-2 puffs into the lungs every 6 (six) hours as needed for wheezing or shortness of breath. 03/07/19   Azzie Glatter, FNP  albuterol (PROVENTIL) (2.5 MG/3ML) 0.083% nebulizer solution Take 3 mLs (2.5 mg total) by nebulization every 6 (six) hours as needed for wheezing or shortness of breath. Patient not taking: Reported on 09/10/2020 03/07/19   Azzie Glatter, FNP  cetirizine (ZYRTEC) 10 MG tablet Take 1 tablet (10 mg total) by mouth daily. 02/14/20   Azzie Glatter, FNP  doxycycline (VIBRAMYCIN) 100 MG capsule Take 1 capsule (100 mg total) by mouth 2 (two) times daily for 7 days. 12/29/21 01/05/22  Caccavale, Sophia, PA-C  fluticasone (FLONASE) 50 MCG/ACT nasal spray PLACE 2 SPRAYS INTO BOTH NOSTRILS DAILY FOR 14 DAYS. 12/19/20 12/19/21  Tedd Sias, PA  ibuprofen (ADVIL) 600 MG tablet Take 1 tablet (600 mg total) by mouth every 6 (six) hours as needed. Patient not taking: Reported on 09/10/2020 08/31/20   Isla Pence, MD  montelukast (SINGULAIR) 10 MG tablet Take 1 tablet (10 mg total) by mouth at bedtime. 02/14/20   Azzie Glatter, FNP      Allergies    Flagyl  [metronidazole]    Review of Systems   Review of Systems  Constitutional:  Negative for chills and fever.  HENT:  Negative for ear pain and sore throat.   Eyes:  Negative for pain and visual disturbance.  Respiratory:  Negative for cough and shortness of breath.   Cardiovascular:  Negative for chest pain and palpitations.  Gastrointestinal:  Negative for abdominal pain and vomiting.  Genitourinary:  Negative for dysuria and hematuria.  Musculoskeletal:  Negative for arthralgias and back pain.  Skin:  Negative for color change and rash.  Neurological:  Negative for seizures and syncope.  All other systems reviewed and are negative.  Physical Exam Updated Vital Signs BP (!) 125/93 (BP Location: Right Arm)    Pulse 96    Temp 98.4 F (36.9 C) (Oral)    Resp 16    Ht 5\' 3"  (1.6 m)    Wt 54.9 kg    LMP 12/10/2021    SpO2 98%    BMI 21.43 kg/m  Physical Exam Vitals and nursing note reviewed.  Constitutional:      General: She is not in acute distress.    Appearance: She is well-developed.  HENT:     Head: Normocephalic and atraumatic.  Eyes:     Conjunctiva/sclera: Conjunctivae normal.  Cardiovascular:     Rate and Rhythm: Normal rate and regular rhythm.     Heart sounds: No murmur heard. Pulmonary:  Effort: Pulmonary effort is normal. No respiratory distress.     Breath sounds: Normal breath sounds.  Chest:    Abdominal:     Palpations: Abdomen is soft.     Tenderness: There is no abdominal tenderness.  Musculoskeletal:        General: No swelling.     Cervical back: Neck supple.  Skin:    General: Skin is warm and dry.     Capillary Refill: Capillary refill takes less than 2 seconds.  Neurological:     Mental Status: She is alert.  Psychiatric:        Mood and Affect: Mood normal.    ED Results / Procedures / Treatments   Labs (all labs ordered are listed, but only abnormal results are displayed) Labs Reviewed - No data to display  EKG None  Radiology No  results found.  Procedures Procedures    Medications Ordered in ED Medications - No data to display  ED Course/ Medical Decision Making/ A&P                           Medical Decision Making  8:48 AM  31 yo female presenting for left breast abscess. Pt is Aox3, no acute distress, afebrile, with stable vitals. Physical exam demonstrates 2 x 2 cm region of induration on left breast at 12 o'clock position above areola. Swelling, warmth, and tenderness. No active purulent drainage. Toradol given for pain with antibiotics.  No signs/symptoms of sepsis.  Patient in no distress and overall condition improved here in the ED. Hydradenitis described in detail with pt with proper f/u instructions due to hx of previous abscesses. Detailed discussions were had with the patient regarding current findings, and need for close f/u with PCP or on call doctor. The patient has been instructed to return immediately if the symptoms worsen in any way for re-evaluation. Patient verbalized understanding and is in agreement with current care plan. All questions answered prior to discharge.         Final Clinical Impression(s) / ED Diagnoses Final diagnoses:  Abscess  Hydradenitis    Rx / DC Orders ED Discharge Orders     None         Lianne Cure, DO [id number]

## 2022-01-01 NOTE — ED Triage Notes (Signed)
Pt arrives with reports of abscess to left breast now draining on its own states that she was sen here recently for the same and started on an antibiotic, states that she is now having some vaginal irritation from the antibiotic.

## 2023-02-14 ENCOUNTER — Other Ambulatory Visit: Payer: Self-pay

## 2023-02-14 ENCOUNTER — Emergency Department (HOSPITAL_BASED_OUTPATIENT_CLINIC_OR_DEPARTMENT_OTHER)
Admission: EM | Admit: 2023-02-14 | Discharge: 2023-02-14 | Disposition: A | Discharge status: Home / Self Care | Payer: Self-pay | Attending: Emergency Medicine | Admitting: Emergency Medicine

## 2023-02-14 ENCOUNTER — Encounter (HOSPITAL_BASED_OUTPATIENT_CLINIC_OR_DEPARTMENT_OTHER): Payer: Self-pay | Admitting: Emergency Medicine

## 2023-02-14 DIAGNOSIS — J45909 Unspecified asthma, uncomplicated: Secondary | ICD-10-CM | POA: Insufficient documentation

## 2023-02-14 DIAGNOSIS — U071 COVID-19: Secondary | ICD-10-CM | POA: Insufficient documentation

## 2023-02-14 LAB — RESP PANEL BY RT-PCR (RSV, FLU A&B, COVID)  RVPGX2
Influenza A by PCR: NEGATIVE
Influenza B by PCR: NEGATIVE
Resp Syncytial Virus by PCR: NEGATIVE
SARS Coronavirus 2 by RT PCR: POSITIVE — AB

## 2023-02-14 MED ORDER — IBUPROFEN 800 MG PO TABS
800.0000 mg | ORAL_TABLET | Freq: Once | ORAL | Status: AC
  Administered 2023-02-14: 800 mg via ORAL
  Filled 2023-02-14: qty 1

## 2023-02-14 NOTE — ED Provider Notes (Signed)
[facility] Provider Note   CSN: [id number] Arrival date & time: 02/14/23  [id number]     History  Chief Complaint  Patient presents with   Generalized Body Aches    Kimberly Golden is a 32 y.o. female with past medical history significant for asthma who presents to the ED complaining of generalized bodyaches and intermittent fever since yesterday.  Patient states that she was fine when she went to work, but suddenly had onset of symptoms and "felt like she got hit by a train".  She has had mild relief with generic [place] and NyQuil medications.  Highest recorded temperature at home was 100.2 F.  She has not had any medications today.  She also felt that she was somewhat congested and has a slightly sore throat.  Denies cough, nausea, vomiting, diarrhea, syncope, dizziness, lightheadedness.  No known sick contacts, but patient does work around [employer].       Home Medications Prior to Admission medications   Medication Sig Start Date End Date Taking? Authorizing Provider  albuterol (PROVENTIL HFA;VENTOLIN HFA) 108 (90 Base) MCG/ACT inhaler Inhale 1-2 puffs into the lungs every 6 (six) hours as needed for wheezing or shortness of breath. 03/07/19   Azzie Glatter, FNP  albuterol (PROVENTIL) (2.5 MG/3ML) 0.083% nebulizer solution Take 3 mLs (2.5 mg total) by nebulization every 6 (six) hours as needed for wheezing or shortness of breath. Patient not taking: Reported on 09/10/2020 03/07/19   Azzie Glatter, FNP  cetirizine (ZYRTEC) 10 MG tablet Take 1 tablet (10 mg total) by mouth daily. 02/14/20   Azzie Glatter, FNP  fluticasone (FLONASE) 50 MCG/ACT nasal spray PLACE 2 SPRAYS INTO BOTH NOSTRILS DAILY FOR 14 DAYS. 12/19/20 12/19/21  Tedd Sias, PA  ibuprofen (ADVIL) 600 MG tablet Take 1 tablet (600 mg total) by mouth every 6 (six) hours as needed. Patient not taking: Reported on 09/10/2020 08/31/20   Isla Pence, MD  ibuprofen (ADVIL)  800 MG tablet Take 1 tablet (800 mg total) by mouth 3 (three) times daily. [id number]   Campbell Stall P, DO  montelukast (SINGULAIR) 10 MG tablet Take 1 tablet (10 mg total) by mouth at bedtime. 02/14/20   Azzie Glatter, FNP      Allergies    Flagyl [metronidazole]    Review of Systems   Review of Systems  Constitutional:  Positive for fever. Negative for chills.  HENT:  Positive for congestion and sore throat. Negative for rhinorrhea.   Respiratory:  Negative for cough and shortness of breath.   Cardiovascular:  Negative for chest pain.  Gastrointestinal:  Negative for abdominal pain, diarrhea, nausea and vomiting.  Neurological:  Negative for dizziness, syncope and light-headedness.    Physical Exam Updated Vital Signs BP 118/82 (BP Location: Right Arm)   Pulse 82   Temp 98.5 F (36.9 C) (Oral)   Resp 16   Ht '5\' 3"'$  (1.6 m)   Wt 49.9 kg   LMP 02/08/2023 (Approximate)   SpO2 99%   BMI 19.49 kg/m  Physical Exam Vitals and nursing note reviewed.  Constitutional:      General: She is not in acute distress.    Appearance: She is not ill-appearing.  HENT:     Right Ear: Tympanic membrane and ear canal normal. Tympanic membrane is not erythematous or bulging.     Left Ear: Tympanic membrane and ear canal normal. Tympanic membrane is not erythematous or bulging.  Nose: Congestion present. No rhinorrhea.     Mouth/Throat:     Lips: Pink.     Mouth: Mucous membranes are moist.     Pharynx: Oropharynx is clear. Uvula midline. Posterior oropharyngeal erythema present. No oropharyngeal exudate.  Cardiovascular:     Rate and Rhythm: Normal rate and regular rhythm.     Pulses: Normal pulses.     Heart sounds: Normal heart sounds.  Pulmonary:     Effort: Pulmonary effort is normal. No respiratory distress.     Breath sounds: Normal breath sounds and air entry.  Abdominal:     General: Abdomen is flat. Bowel sounds are normal. There is no distension.     Palpations: Abdomen is  soft.     Tenderness: There is no abdominal tenderness.  Musculoskeletal:     Cervical back: Full passive range of motion without pain.  Skin:    General: Skin is warm and dry.     Capillary Refill: Capillary refill takes less than 2 seconds.  Neurological:     Mental Status: She is alert. Mental status is at baseline.  Psychiatric:        Mood and Affect: Mood normal.        Behavior: Behavior normal.     ED Results / Procedures / Treatments   Labs (all labs ordered are listed, but only abnormal results are displayed) Labs Reviewed  RESP PANEL BY RT-PCR (RSV, FLU A&B, COVID)  RVPGX2 - Abnormal; Notable for the following components:      Result Value   SARS Coronavirus 2 by RT PCR POSITIVE (*)    All other components within normal limits    EKG None  Radiology No results found.  Procedures Procedures    Medications Ordered in ED Medications  ibuprofen (ADVIL) tablet 800 mg (800 mg Oral Given 02/14/23 0932)    ED Course/ Medical Decision Making/ A&P                             Medical Decision Making Risk Prescription drug management.   This patient presents to the ED with chief complaint(s) of flulike symptoms with pertinent past medical history of asthma.  The complaint involves an extensive differential diagnosis and also carries with it a high risk of complications and morbidity.    The differential diagnosis includes COVID, influenza, RSV, adenovirus, other viral etiology URI  The initial plan is to obtain respiratory panel  Initial Assessment:   Exam significant for a mildly irritable appearing patient who is not toxic and is not in acute distress.  Posterior oropharynx is mildly erythematous without exudate.  Bilateral TMs and EACs are unremarkable.  Lungs are clear to auscultation bilaterally with adequate tidal volume.  Heart rate is normal in the 80s with regular rhythm.  Abdomen is soft and nontender to palpation.  She does have mild congestion without  rhinorrhea.  Independent ECG/labs interpretation:  The following labs were independently interpreted:  COVID-positive, influenza and RSV negative  Independent visualization and interpretation of imaging: I independently visualized the following imaging with scope of interpretation limited to determining acute life threatening conditions related to emergency care: Not indicated  Treatment and Reassessment: Patient was given oral dose of ibuprofen while in ED for body aches.  Patient is COVID-positive.  Discussed CDC guidelines with patient regarding COVID quarantine and isolation.  Patient was provided a work note.  Discussed with patient continued use of over-the-counter medications for supportive  care.  Patient does have moderate asthma which makes her a candidate for Paxlovid.  Discussed this medication with patient who opted not to take it at this time and she will continue to use over-the-counter medications for her symptoms.  Patient has not had any asthma flareups in several years and is otherwise healthy.    Disposition:   The patient has been appropriately medically screened and/or stabilized in the ED. I have low suspicion for any other emergent medical condition which would require further screening, evaluation or treatment in the ED or require inpatient management. At time of discharge the patient is hemodynamically stable and in no acute distress. I have discussed work-up results and diagnosis with patient and answered all questions. Patient is agreeable with discharge plan. We discussed strict return precautions for returning to the emergency department and they verbalized understanding.            Final Clinical Impression(s) / ED Diagnoses Final diagnoses:  [id number]    Rx / DC Orders ED Discharge Orders     None         Pat Kocher, [place] 02/14/23 [id number]    Wyvonnia Dusky, MD 02/14/23 1022

## 2023-02-14 NOTE — Discharge Instructions (Addendum)
Thank you for allowing me to be part of your care today.  You tested positive for COVID-19.  Per CDC guidelines, you will need to isolate from others for 5 days from onset of symptoms.  I have provided you a work note to cover this time.   I recommend for body aches alternating 800 mg of ibuprofen every 3-4 hours with 1000 mg of Tylenol.  Do not exceed 3200 mg of ibuprofen or 4000 mg of Tylenol in a 24-hour period.   You may also use over-the-counter medications for cough and cold symptoms.  I recommend for dry, nonproductive cough Delsym (dextromethorphan).  Be sure to get plenty of rest and stay well-hydrated.  Return to the ED if you have worsening of your symptoms or if you have any new concerns.

## 2023-02-14 NOTE — ED Triage Notes (Addendum)
Generalized body aches and intermittent fevers since yesterday. Mild relief with otc meds. Highest recorded temp at home 100.2 F. NO OTC MEDS PTA. Denies other sx. Denies known sick exposure.

## 2023-02-18 ENCOUNTER — Emergency Department (HOSPITAL_BASED_OUTPATIENT_CLINIC_OR_DEPARTMENT_OTHER)
Admission: EM | Admit: 2023-02-18 | Discharge: 2023-02-18 | Discharge status: Against Medical Advice | Payer: Self-pay | Source: Home / Self Care

## 2023-02-18 ENCOUNTER — Other Ambulatory Visit: Payer: Self-pay

## 2023-10-11 ENCOUNTER — Encounter (HOSPITAL_BASED_OUTPATIENT_CLINIC_OR_DEPARTMENT_OTHER): Payer: Self-pay | Admitting: Emergency Medicine

## 2023-10-11 ENCOUNTER — Emergency Department (HOSPITAL_BASED_OUTPATIENT_CLINIC_OR_DEPARTMENT_OTHER)
Admission: EM | Admit: 2023-10-11 | Discharge: 2023-10-11 | Disposition: A | Discharge status: Home / Self Care | Payer: Self-pay | Attending: Emergency Medicine | Admitting: Emergency Medicine

## 2023-10-11 ENCOUNTER — Other Ambulatory Visit: Payer: Self-pay

## 2023-10-11 DIAGNOSIS — K0889 Other specified disorders of teeth and supporting structures: Secondary | ICD-10-CM | POA: Insufficient documentation

## 2023-10-11 MED ORDER — CLINDAMYCIN HCL 150 MG PO CAPS: 450.0000 mg | ORAL_CAPSULE | Freq: Three times a day (TID) | ORAL | 0 refills | Status: AC

## 2023-10-11 MED ORDER — CLINDAMYCIN HCL 150 MG PO CAPS
450.0000 mg | ORAL_CAPSULE | Freq: Once | ORAL | Status: AC
  Administered 2023-10-11: 450 mg via ORAL
  Filled 2023-10-11: qty 3

## 2023-10-11 NOTE — Discharge Instructions (Signed)
Please return for rapid swelling fever or inability to swallow.  I have given you a list of dentists to try and fall when you see this.  Take 4 over the counter ibuprofen tablets 3 times a day or 2 over-the-counter naproxen tablets twice a day for pain. Also take tylenol 1000mg (2 extra strength) four times a day.

## 2023-10-11 NOTE — ED Triage Notes (Addendum)
Pt presents with c/o broken tooth upper left side of mouth x 1 month. Pt has swelling to left jaw, with ear and eye pain. Tried otc meds and heat pack at home.

## 2023-10-11 NOTE — ED Provider Notes (Signed)
[facility] Provider Note   CSN: [id number] Arrival date & time: 10/11/23  [id number]     History  Chief Complaint  Patient presents with   Dental Pain    Kimberly Golden is a 32 y.o. female.  32 yo F with a chief complaints of left upper dental pain.  This been going on for some time but really worsened yesterday morning when she woke up.  She has that her face was a bit swollen.  Has been getting worse throughout the day.  Noticed it more when she was trying to go to sleep.  No fevers.  No difficulty swallowing.   Dental Pain      Home Medications Prior to Admission medications   Medication Sig Start Date End Date Taking? Authorizing Provider  clindamycin (CLEOCIN) 150 MG capsule Take 3 capsules (450 mg total) by mouth 3 (three) times daily for 7 days. 10/11/23 10/18/23 Yes Melene Plan, DO  albuterol (PROVENTIL HFA;VENTOLIN HFA) 108 (90 Base) MCG/ACT inhaler Inhale 1-2 puffs into the lungs every 6 (six) hours as needed for wheezing or shortness of breath. 03/07/19   Kallie Locks, FNP  albuterol (PROVENTIL) (2.5 MG/3ML) 0.083% nebulizer solution Take 3 mLs (2.5 mg total) by nebulization every 6 (six) hours as needed for wheezing or shortness of breath. Patient not taking: Reported on 09/10/2020 03/07/19   Kallie Locks, FNP  cetirizine (ZYRTEC) 10 MG tablet Take 1 tablet (10 mg total) by mouth daily. 02/14/20   Kallie Locks, FNP  fluticasone (FLONASE) 50 MCG/ACT nasal spray PLACE 2 SPRAYS INTO BOTH NOSTRILS DAILY FOR 14 DAYS. 12/19/20 12/19/21  Gailen Shelter, PA  ibuprofen (ADVIL) 600 MG tablet Take 1 tablet (600 mg total) by mouth every 6 (six) hours as needed. Patient not taking: Reported on 09/10/2020 08/31/20   Jacalyn Lefevre, MD  ibuprofen (ADVIL) 800 MG tablet Take 1 tablet (800 mg total) by mouth 3 (three) times daily. 01/01/22   Edwin Dada P, DO  montelukast (SINGULAIR) 10 MG tablet Take 1 tablet (10 mg total) by mouth at  bedtime. 02/14/20   Kallie Locks, FNP      Allergies    Flagyl [metronidazole]    Review of Systems   Review of Systems  Physical Exam Updated Vital Signs BP (!) 121/92 (BP Location: Right Arm)   Pulse 79   Temp 98.1 F (36.7 C) (Oral)   Resp 16   Ht 5\' 3"  (1.6 m)   Wt 56.7 kg   SpO2 100%   BMI 22.14 kg/m  Physical Exam Vitals and nursing note reviewed.  Constitutional:      General: She is not in acute distress.    Appearance: She is well-developed. She is not diaphoretic.  HENT:     Head: Normocephalic and atraumatic.     Mouth/Throat:     Comments: Fractured left upper first molar.  There is surrounding edema to the left maxillary area.  I do not appreciate any obvious area of fluctuance.  Tolerating secretions without difficulty. Eyes:     Pupils: Pupils are equal, round, and reactive to light.  Cardiovascular:     Rate and Rhythm: Normal rate and regular rhythm.     Heart sounds: No murmur heard.    No friction rub. No gallop.  Pulmonary:     Effort: Pulmonary effort is normal.     Breath sounds: No wheezing or rales.  Abdominal:     General: There  is no distension.     Palpations: Abdomen is soft.     Tenderness: There is no abdominal tenderness.  Musculoskeletal:        General: No tenderness.     Cervical back: Normal range of motion and neck supple.  Skin:    General: Skin is warm and dry.  Neurological:     Mental Status: She is alert and oriented to person, place, and time.  Psychiatric:        Behavior: Behavior normal.     ED Results / Procedures / Treatments   Labs (all labs ordered are listed, but only abnormal results are displayed) Labs Reviewed - No data to display  EKG None  Radiology No results found.  Procedures Procedures    Medications Ordered in ED Medications  clindamycin (CLEOCIN) capsule 450 mg (has no administration in time range)    ED Course/ Medical Decision Making/ A&P                                  Medical Decision Making Risk Prescription drug management.   32 yo F with a chief complaints of left upper dental pain.  This has been going on for some time but worsened over the past 24 hours.  Clinically she likely has a dental infection.  I do not appreciate an obvious abscess for I&D.  I will start her on oral antibiotics.  Given info for dentistry follow-up.  5:54 AM:  I have discussed the diagnosis/risks/treatment options with the patient.  Evaluation and diagnostic testing in the emergency department does not suggest an emergent condition requiring admission or immediate intervention beyond what has been performed at this time.  They will follow up with Dentistry. We also discussed returning to the ED immediately if new or worsening sx occur. We discussed the sx which are most concerning (e.g., sudden worsening pain, fever, inability to tolerate by mouth) that necessitate immediate return. Medications administered to the patient during their visit and any new prescriptions provided to the patient are listed below.  Medications given during this visit Medications  clindamycin (CLEOCIN) capsule 450 mg (has no administration in time range)     The patient appears reasonably screen and/or stabilized for discharge and I doubt any other medical condition or other [facility] requiring further screening, evaluation, or treatment in the ED at this time prior to discharge.         Final Clinical Impression(s) / ED Diagnoses Final diagnoses:  Pain, dental    Rx / DC Orders ED Discharge Orders          Ordered    clindamycin (CLEOCIN) 150 MG capsule  3 times daily        10/11/23 0550              Melene Plan, DO 10/11/23 [phone]

## 2023-12-10 ENCOUNTER — Emergency Department (HOSPITAL_BASED_OUTPATIENT_CLINIC_OR_DEPARTMENT_OTHER)
Admission: EM | Admit: 2023-12-10 | Discharge: 2023-12-10 | Disposition: A | Discharge status: Home / Self Care | Payer: Self-pay | Attending: Emergency Medicine | Admitting: Emergency Medicine

## 2023-12-10 ENCOUNTER — Emergency Department (HOSPITAL_BASED_OUTPATIENT_CLINIC_OR_DEPARTMENT_OTHER): Payer: Self-pay

## 2023-12-10 ENCOUNTER — Other Ambulatory Visit: Payer: Self-pay

## 2023-12-10 ENCOUNTER — Encounter (HOSPITAL_BASED_OUTPATIENT_CLINIC_OR_DEPARTMENT_OTHER): Payer: Self-pay | Admitting: Emergency Medicine

## 2023-12-10 DIAGNOSIS — J3489 Other specified disorders of nose and nasal sinuses: Secondary | ICD-10-CM | POA: Insufficient documentation

## 2023-12-10 DIAGNOSIS — Z7951 Long term (current) use of inhaled steroids: Secondary | ICD-10-CM | POA: Insufficient documentation

## 2023-12-10 DIAGNOSIS — J45909 Unspecified asthma, uncomplicated: Secondary | ICD-10-CM | POA: Insufficient documentation

## 2023-12-10 DIAGNOSIS — Z20822 Contact with and (suspected) exposure to covid-19: Secondary | ICD-10-CM | POA: Insufficient documentation

## 2023-12-10 DIAGNOSIS — J029 Acute pharyngitis, unspecified: Secondary | ICD-10-CM | POA: Insufficient documentation

## 2023-12-10 DIAGNOSIS — R0981 Nasal congestion: Secondary | ICD-10-CM

## 2023-12-10 LAB — RESP PANEL BY RT-PCR (RSV, FLU A&B, COVID)  RVPGX2
Influenza A by PCR: NEGATIVE
Influenza B by PCR: NEGATIVE
Resp Syncytial Virus by PCR: NEGATIVE
SARS Coronavirus 2 by RT PCR: NEGATIVE

## 2023-12-10 NOTE — ED Provider Notes (Signed)
 [facility] Provider Note   CSN: [id number] Arrival date & time: 12/10/23  9478     History  Chief Complaint  Patient presents with   Nasal Congestion    Kimberly Golden is a 33 y.o. female with history of asthma who presents the emergency department complaining of nasal congestion, sinus pressure, nonproductive cough.  Has not had to use her inhaler.  Denies any fevers, chills, chest pain, shortness of breath.  HPI     Home Medications Prior to Admission medications   Medication Sig Start Date End Date Taking? Authorizing Provider  albuterol  (PROVENTIL  HFA;VENTOLIN  HFA) 108 (90 Base) MCG/ACT inhaler Inhale 1-2 puffs into the lungs every 6 (six) hours as needed for wheezing or shortness of breath. 03/07/19   Stroud, Natalie M, FNP  albuterol  (PROVENTIL ) (2.5 MG/3ML) 0.083% nebulizer solution Take 3 mLs (2.5 mg total) by nebulization every 6 (six) hours as needed for wheezing or shortness of breath. Patient not taking: Reported on 09/10/2020 03/07/19   Stroud, Natalie M, FNP  cetirizine  (ZYRTEC ) 10 MG tablet Take 1 tablet (10 mg total) by mouth daily. 02/14/20   Stroud, Natalie M, FNP  fluticasone  (FLONASE ) 50 MCG/ACT nasal spray PLACE 2 SPRAYS INTO BOTH NOSTRILS DAILY FOR 14 DAYS. 12/19/20 12/19/21  Neldon Hamp RAMAN, PA  ibuprofen  (ADVIL ) 600 MG tablet Take 1 tablet (600 mg total) by mouth every 6 (six) hours as needed. Patient not taking: Reported on 09/10/2020 08/31/20   Dean Clarity, MD  ibuprofen  (ADVIL ) 800 MG tablet Take 1 tablet (800 mg total) by mouth 3 (three) times daily. 01/01/22   Elnor Hila P, DO  montelukast  (SINGULAIR ) 10 MG tablet Take 1 tablet (10 mg total) by mouth at bedtime. 02/14/20   Stroud, Natalie M, FNP      Allergies    Flagyl  [metronidazole ]    Review of Systems   Review of Systems  HENT:  Positive for congestion, sinus pressure and sore throat.   Respiratory:  Positive for cough.   All other systems reviewed  and are negative.   Physical Exam Updated Vital Signs BP 122/87 (BP Location: Left Arm)   Pulse 82   Temp 98.8 F (37.1 C) (Oral)   Resp 18   Ht 5' 3 (1.6 m)   Wt 54.4 kg   LMP 11/29/2023 (Exact Date)   SpO2 100%   BMI 21.26 kg/m  Physical Exam Vitals and nursing note reviewed.  Constitutional:      Appearance: Normal appearance.  HENT:     Head: Normocephalic and atraumatic.     Nose: Congestion present.     Mouth/Throat:     Lips: Pink.     Mouth: Mucous membranes are moist.     Pharynx: Oropharynx is clear. Uvula midline. Posterior oropharyngeal erythema present.     Tonsils: No tonsillar exudate or tonsillar abscesses.  Eyes:     Conjunctiva/sclera: Conjunctivae normal.  Cardiovascular:     Rate and Rhythm: Normal rate and regular rhythm.  Pulmonary:     Effort: Pulmonary effort is normal. No respiratory distress.     Breath sounds: Normal breath sounds.  Abdominal:     General: There is no distension.     Palpations: Abdomen is soft.     Tenderness: There is no abdominal tenderness.  Skin:    General: Skin is warm and dry.  Neurological:     General: No focal deficit present.     Mental Status: She is alert.  ED Results / Procedures / Treatments   Labs (all labs ordered are listed, but only abnormal results are displayed) Labs Reviewed  RESP PANEL BY RT-PCR (RSV, FLU A&B, COVID)  [id number]    EKG None  Radiology DG Chest 2 View Result Date: 12/10/2023 CLINICAL DATA:  Cough EXAM: CHEST - 2 VIEW COMPARISON:  08/25/2020 FINDINGS: Normal heart size and mediastinal contours. No acute infiltrate or edema. No effusion or pneumothorax. No acute osseous findings. IMPRESSION: No active cardiopulmonary disease. Electronically Signed   By: Dorn Roulette M.D.   On: 12/10/2023 07:58    Procedures Procedures    Medications Ordered in ED Medications - No data to display  ED Course/ Medical Decision Making/ A&P                                 Medical  Decision Making Amount and/or Complexity of Data Reviewed Radiology: ordered.   This patient is a 33 y.o. female who presents to the ED for concern of nasal congestion, sinus pressure, cough, sore throat x 1 day.   Differential diagnoses prior to evaluation: The emergent differential diagnosis includes, but is not limited to,  upper respiratory infection, lower respiratory infection, allergies, asthma, irritants, sinus/esophageal foreign body, medications, reflux, interstitial lung disease, postnasal drip, viral illness, sepsis. This is not an exhaustive differential.   Past Medical History / Co-morbidities / Additional history: Chart reviewed. Pertinent results include: asthma  Physical Exam: Physical exam performed. The pertinent findings include: Normal vital signs, no acute distress.  Oropharyngeal erythema, without exudate or tonsillar swelling.  Lab Tests/Imaging studies: I personally interpreted labs/imaging and the pertinent results include:  respiratory panel negative. CXR negative.    Disposition: After consideration of the diagnostic results and the patients response to treatment, I feel that emergency department workup does not suggest an emergent condition requiring admission or immediate intervention beyond what has been performed at this time. Patient with symptoms consistent with viral URI.  Vitals are stable. No signs of dehydration, tolerating PO's.  Lungs are clear.   The plan is: Patient will be discharged with instructions to orally hydrate, rest, and use over-the-counter medications such as anti-inflammatories such as ibuprofen  and Tylenol  for fever. The patient is safe for discharge and has been instructed to return immediately for worsening symptoms, change in symptoms or any other concerns.  Final Clinical Impression(s) / ED Diagnoses Final diagnoses:  Nasal congestion  Sinus pressure  Sore throat    Rx / DC Orders ED Discharge Orders     None       Portions of this report may have been transcribed using voice recognition software. Every effort was made to ensure accuracy; however, inadvertent computerized transcription errors may be present.    Clance Baquero T, PA-C 12/10/23 1015    Elnor Bernarda SQUIBB, DO 12/13/23 [phone]

## 2023-12-10 NOTE — ED Triage Notes (Signed)
 Pt presents to the ED via POV with complaints of nasal congestion x 1 day. Pt endorses sinus pressure and a non-productive cough. Hx of asthma - no wheezing noted at this time. A&Ox4 at this time. Denies Fevers, chills, CP or SOB.

## 2023-12-10 NOTE — Discharge Instructions (Addendum)
 You were seen in the emergency department today for nasal congestion, cough, sore throat.  As we discussed you tested negative for flu, COVID, and RSV.  However, I think you likely have a different virus causing your symptoms today. We unfortunately cannot test for them all, but we treat them all the same way.   Your chest x-ray did not show any evidence of pneumonia.   You can take ibuprofen  or Tylenol  for pain or fever, and I recommend alternating between the 2.  Make sure that you are drinking lots of fluids and getting plenty of rest. You can take decongestants as long as you take them with lots of water. You can use lozenges or chloraseptic spray as needed for sore throat.   Please use Tylenol  or ibuprofen  for pain.  You may use 600 mg ibuprofen  every 6 hours or 1000 mg of Tylenol  every 6 hours.  You may choose to alternate between the 2.  This would be most effective.  Do not exceed 4 g of Tylenol  within 24 hours.  Do not exceed 3200 mg ibuprofen  within 24 hours.  You could also take combination products like dayquil or theraflu.   Continue to monitor how you are doing, and return to the emergency department for new or worsening symptoms such as chest pain, difficulty breathing not related to coughing, fever despite medication, or persistent vomiting or diarrhea.  It has been a pleasure taking care of you today and I hope you begin to feel better soon!
